# Patient Record
Sex: Female | Born: 1990 | Race: White | Hispanic: No | Marital: Single | Smoking: Never smoker
Health system: Southern US, Community
[De-identification: ages and names within clinical notes are randomized; demographics above are authoritative.]

## PROBLEM LIST (undated history)

## (undated) DIAGNOSIS — F32A Depression, unspecified: Secondary | ICD-10-CM

## (undated) DIAGNOSIS — F1010 Alcohol abuse, uncomplicated: Secondary | ICD-10-CM

## (undated) DIAGNOSIS — M419 Scoliosis, unspecified: Secondary | ICD-10-CM

## (undated) DIAGNOSIS — F329 Major depressive disorder, single episode, unspecified: Secondary | ICD-10-CM

## (undated) HISTORY — PX: OTHER SURGICAL HISTORY: SHX169

## (undated) HISTORY — DX: Depression, unspecified: F32.A

## (undated) HISTORY — PX: MOLE REMOVAL: SHX2046

## (undated) HISTORY — DX: Major depressive disorder, single episode, unspecified: F32.9

## (undated) HISTORY — DX: Alcohol abuse, uncomplicated: F10.10

## (undated) HISTORY — PX: WISDOM TOOTH EXTRACTION: SHX21

## (undated) HISTORY — DX: Scoliosis, unspecified: M41.9

---

## 2013-06-14 ENCOUNTER — Emergency Department (INDEPENDENT_AMBULATORY_CARE_PROVIDER_SITE_OTHER): Payer: BC Managed Care – PPO

## 2013-06-14 ENCOUNTER — Emergency Department (HOSPITAL_COMMUNITY)
Admission: EM | Admit: 2013-06-14 | Discharge: 2013-06-14 | Disposition: A | Discharge status: Home / Self Care | Payer: BC Managed Care – PPO | Source: Home / Self Care | Attending: Family Medicine | Admitting: Family Medicine

## 2013-06-14 ENCOUNTER — Encounter (HOSPITAL_COMMUNITY): Payer: Self-pay | Admitting: Emergency Medicine

## 2013-06-14 DIAGNOSIS — S63509A Unspecified sprain of unspecified wrist, initial encounter: Secondary | ICD-10-CM

## 2013-06-14 DIAGNOSIS — S63501A Unspecified sprain of right wrist, initial encounter: Secondary | ICD-10-CM

## 2013-06-14 DIAGNOSIS — W06XXXA Fall from bed, initial encounter: Secondary | ICD-10-CM

## 2013-06-14 NOTE — ED Provider Notes (Signed)
CSN: [id number]     Arrival date & time 06/14/13  1214 History   First MD Initiated Contact with Patient 06/14/13 1254     Chief Complaint  Patient presents with  . Wrist Injury   (Consider location/radiation/quality/duration/timing/severity/associated sxs/prior Treatment) Patient is a 22 y.o. female presenting with wrist injury. The history is provided by the patient.  Wrist Injury Location:  Wrist Time since incident:  1 week Injury: yes   Mechanism of injury comment:  Getting off of bunk bed and felt pop in wrist with applying pressure. Wrist location:  R wrist Pain details:    Quality:  Sharp   Radiates to:  Does not radiate   Severity:  Mild   Onset quality:  Sudden Chronicity:  New Dislocation: no     History reviewed. No pertinent past medical history. History reviewed. No pertinent past surgical history. No family history on file. History  Substance Use Topics  . Smoking status: Never Smoker   . Smokeless tobacco: Not on file  . Alcohol Use: Yes   OB History   Grav Para Term Preterm Abortions TAB SAB Ect Mult Living                 Review of Systems  Constitutional: Negative.   Musculoskeletal: Negative for joint swelling.  Skin: Negative.     Allergies  Amoxicillin and Bee venom  Home Medications   Current Outpatient Rx  Name  Route  Sig  Dispense  Refill  . EPINEPHrine (EPIPEN IJ)   Injection   Inject as directed.          BP 122/83  Pulse 80  Temp(Src) 99.3 F (37.4 C) (Oral)  Resp 19  SpO2 100%  LMP 06/07/2013 Physical Exam  Nursing note and vitals reviewed. Constitutional: She is oriented to person, place, and time.  Musculoskeletal: She exhibits tenderness.       Right wrist: She exhibits decreased range of motion, tenderness and bony tenderness. She exhibits no swelling and no deformity.  Neurological: She is alert and oriented to person, place, and time.    ED Course  Procedures (including critical care time) Labs  Review Labs Reviewed - No data to display Imaging Review No results found.  EKG Interpretation    Date/Time:    Ventricular Rate:    PR Interval:    QRS Duration:   QT Interval:    QTC Calculation:   R Axis:     Text Interpretation:              MDM  X-rays reviewed and report per radiologist.     Linna Hoff, MD 06/29/13 [phone]

## 2013-06-14 NOTE — ED Notes (Signed)
Pt c/o right wrist inj onset 1 week Reports she was getting out of bed(loft), and she applied to much pressure on her right wrist getting down She heard and felt a snapping sound sxs include: swelling and pain that increases w/activity She is alert w/no signs of acute distress... Has a splint upon arrival.

## 2014-01-29 IMAGING — CR DG WRIST COMPLETE 3+V*R*
4 series · 4 of 4 positions shown · non-contrast
Comparison: None.

CLINICAL DATA: Wrist injury

EXAM:
RIGHT WRIST - COMPLETE 3+ VIEW

[view not recorded (1 of 4)]
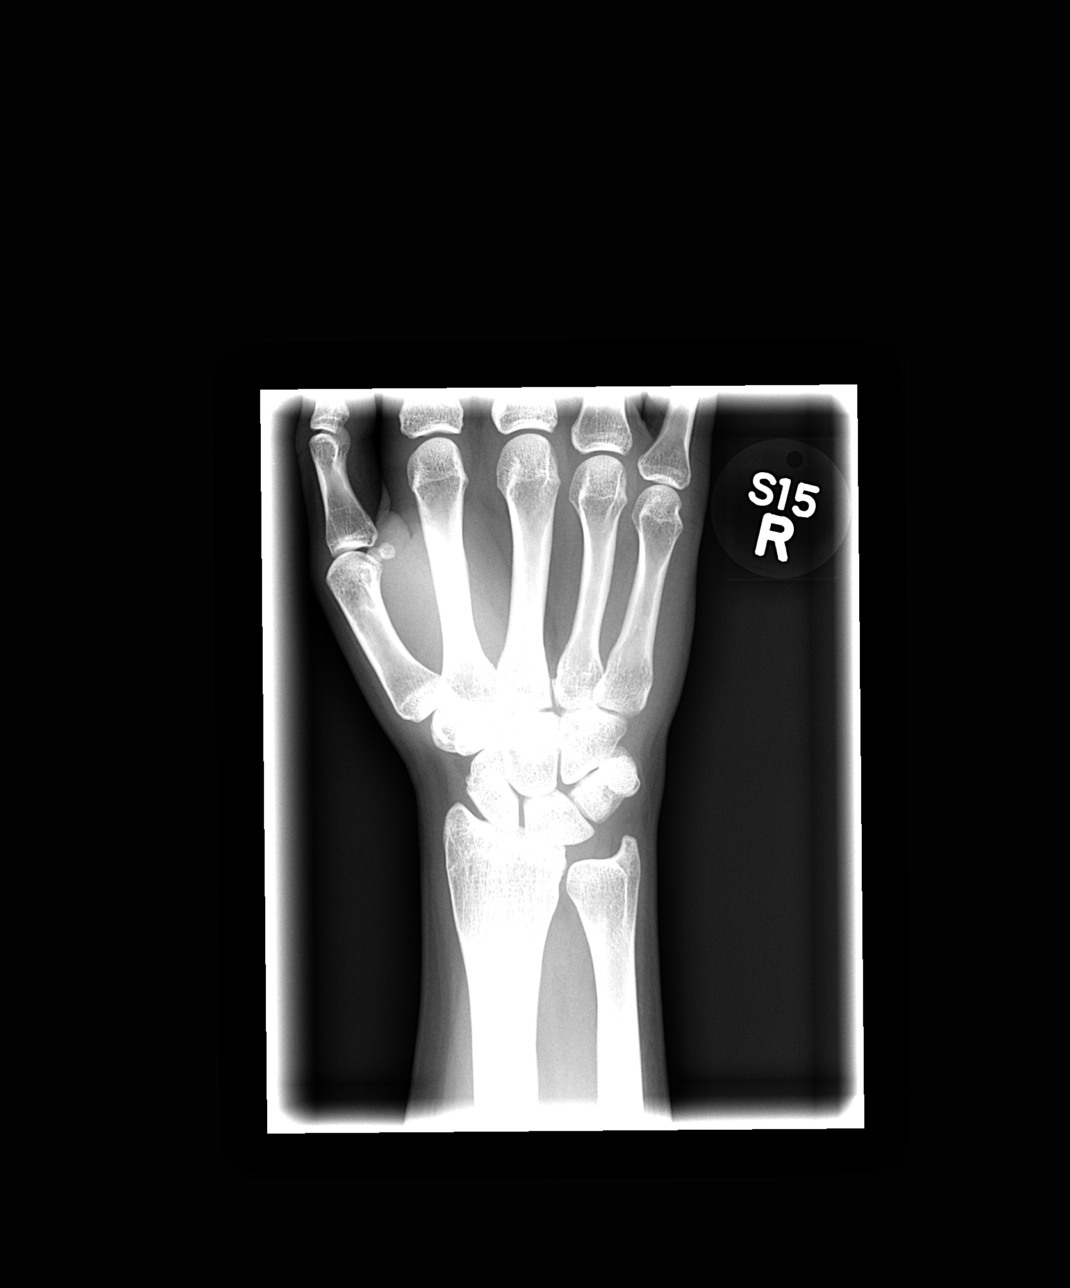

[view not recorded (2 of 4)]
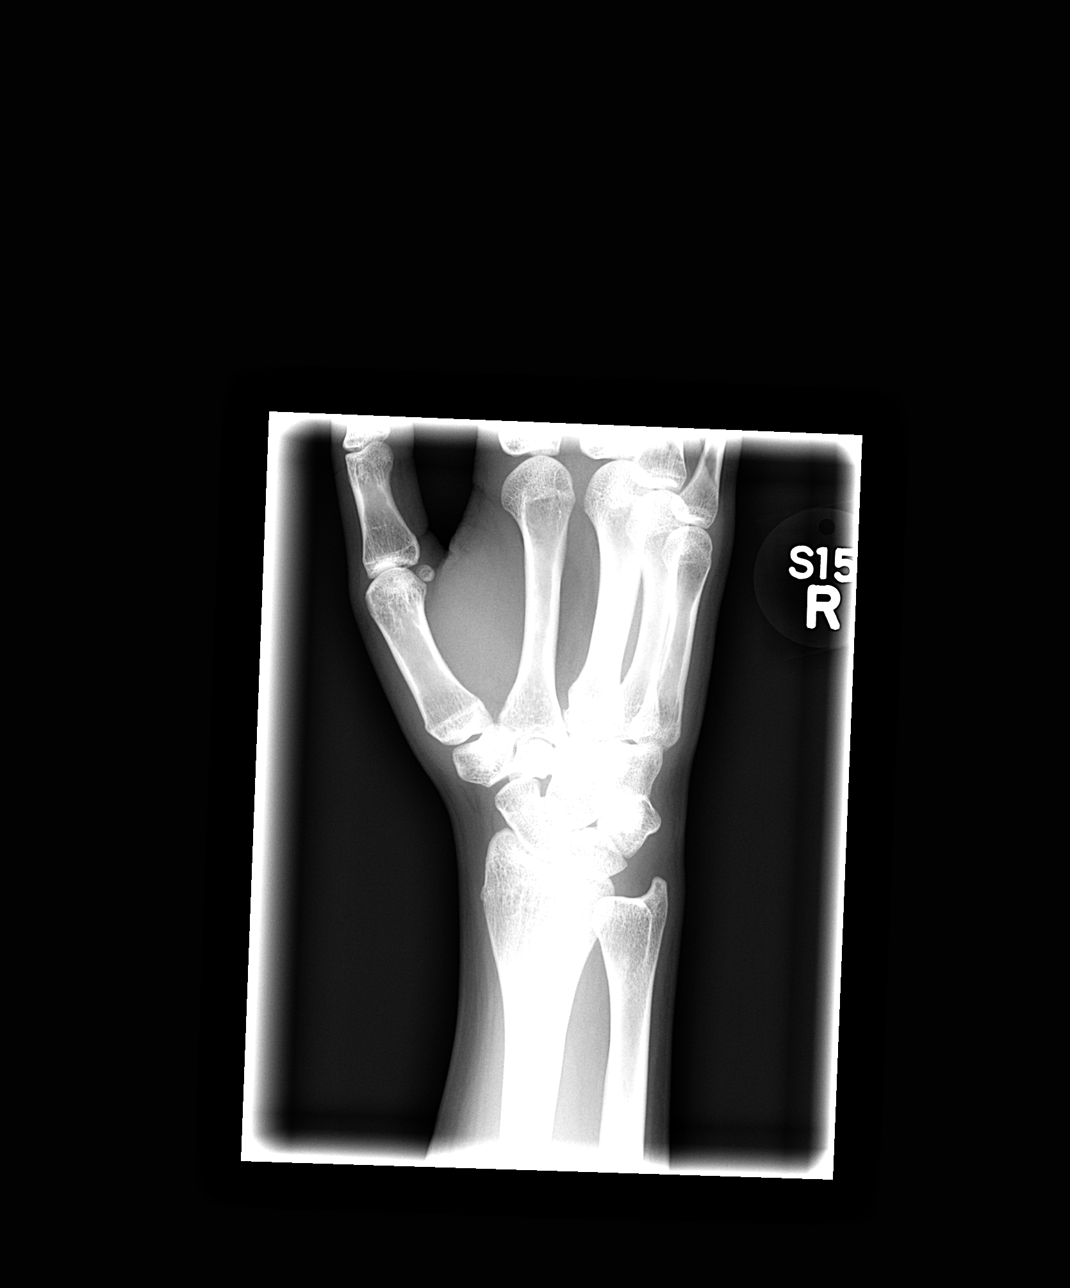

[view not recorded (3 of 4)]
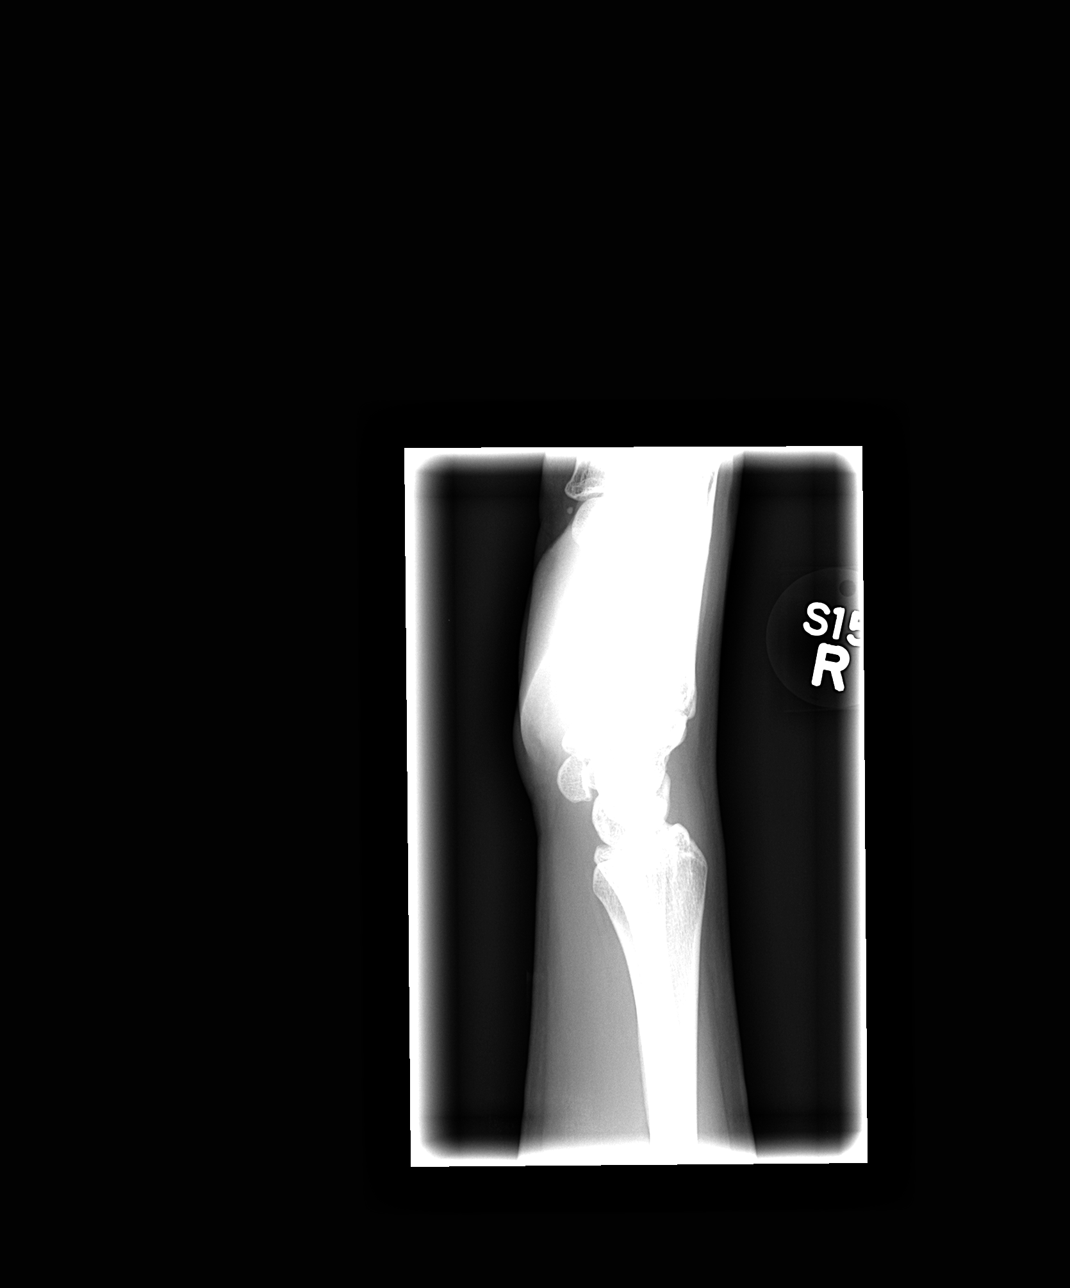

[view not recorded (4 of 4)]
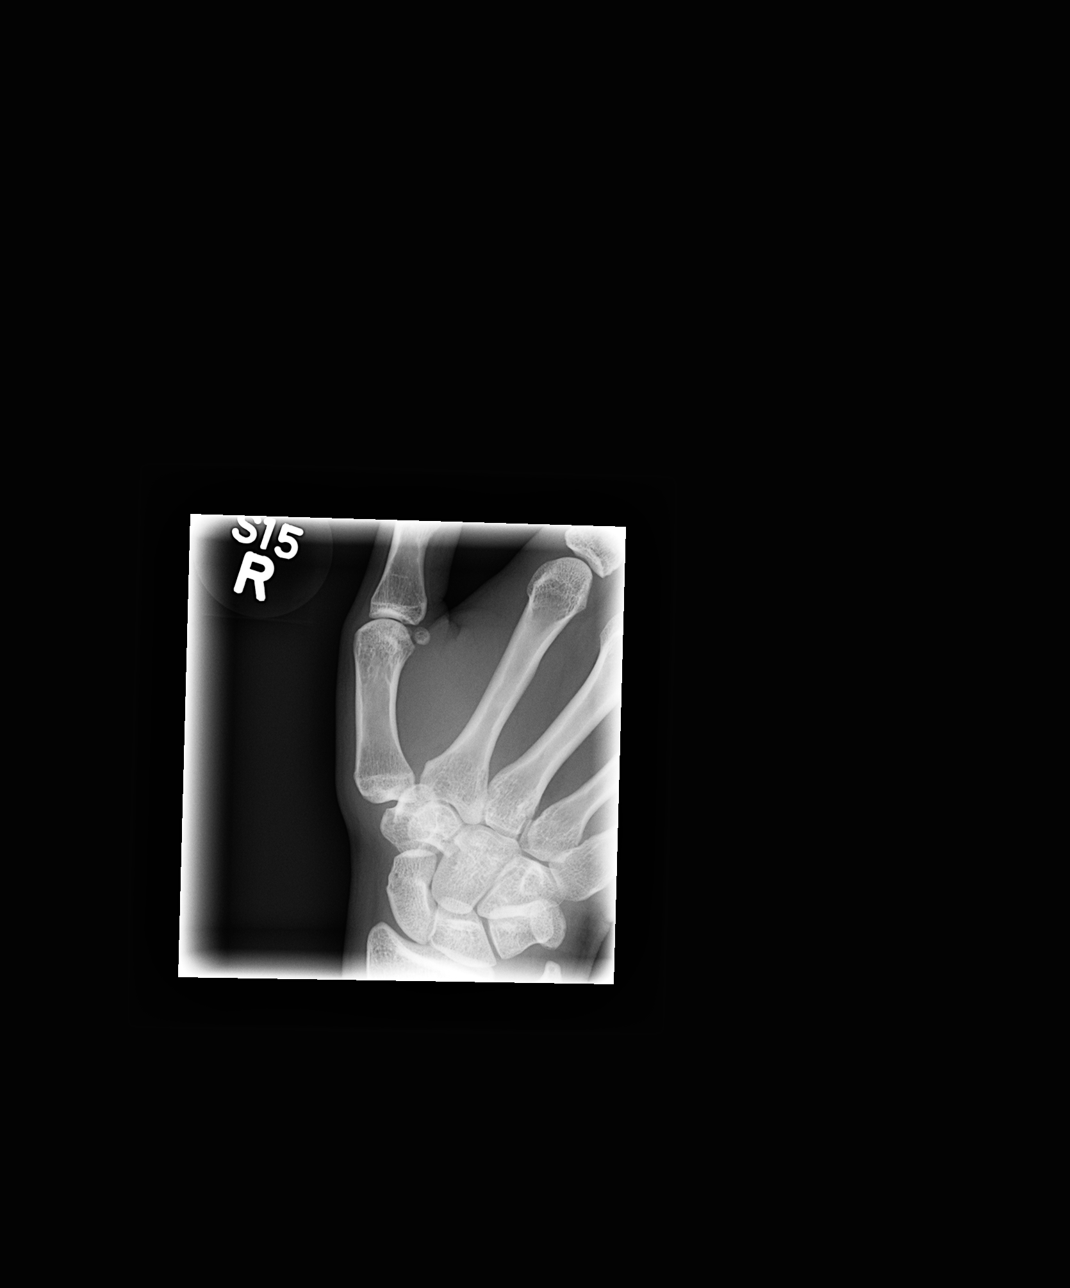

[4 of 4 positions shown; findings below may reference images not displayed]

FINDINGS: No distal radius or ulnar fracture. Radiocarpal joint is intact. No
carpal fracture. No soft tissue abnormality. :

No acute osseous abnormality.

## 2015-02-11 ENCOUNTER — Encounter (HOSPITAL_COMMUNITY): Payer: Self-pay | Admitting: Emergency Medicine

## 2015-02-11 ENCOUNTER — Encounter (HOSPITAL_COMMUNITY): Payer: Self-pay | Admitting: *Deleted

## 2015-02-11 ENCOUNTER — Emergency Department (HOSPITAL_COMMUNITY)
Admission: EM | Admit: 2015-02-11 | Discharge: 2015-02-11 | Disposition: A | Discharge status: Other Acute Inpatient Hospital | Payer: BLUE CROSS/BLUE SHIELD | Attending: Emergency Medicine | Admitting: Emergency Medicine

## 2015-02-11 ENCOUNTER — Inpatient Hospital Stay (HOSPITAL_COMMUNITY)
Admission: AD | Admit: 2015-02-11 | Discharge: 2015-02-14 | DRG: 885 | Disposition: A | Discharge status: Home / Self Care | Payer: BLUE CROSS/BLUE SHIELD | Attending: Psychiatry | Admitting: Psychiatry

## 2015-02-11 DIAGNOSIS — F1010 Alcohol abuse, uncomplicated: Secondary | ICD-10-CM | POA: Diagnosis present

## 2015-02-11 DIAGNOSIS — F322 Major depressive disorder, single episode, severe without psychotic features: Secondary | ICD-10-CM | POA: Insufficient documentation

## 2015-02-11 DIAGNOSIS — Z792 Long term (current) use of antibiotics: Secondary | ICD-10-CM | POA: Insufficient documentation

## 2015-02-11 DIAGNOSIS — F17210 Nicotine dependence, cigarettes, uncomplicated: Secondary | ICD-10-CM | POA: Diagnosis present

## 2015-02-11 DIAGNOSIS — Z880 Allergy status to penicillin: Secondary | ICD-10-CM | POA: Insufficient documentation

## 2015-02-11 DIAGNOSIS — R45851 Suicidal ideations: Secondary | ICD-10-CM

## 2015-02-11 DIAGNOSIS — F329 Major depressive disorder, single episode, unspecified: Secondary | ICD-10-CM | POA: Insufficient documentation

## 2015-02-11 LAB — TSH: TSH: 0.859 u[IU]/mL (ref 0.350–4.500)

## 2015-02-11 LAB — SALICYLATE LEVEL: Salicylate Lvl: <4 mg/dL (ref 2.8–30.0)

## 2015-02-11 LAB — RAPID URINE DRUG SCREEN, HOSP PERFORMED
Amphetamines: NOT DETECTED
Barbiturates: NOT DETECTED
Benzodiazepines: NOT DETECTED
Cocaine: NOT DETECTED
OPIATES: NOT DETECTED
Tetrahydrocannabinol: NOT DETECTED

## 2015-02-11 LAB — COMPREHENSIVE METABOLIC PANEL
ALK PHOS: 49 U/L (ref 38–126)
ALT: 13 U/L — ABNORMAL LOW (ref 14–54)
ANION GAP: 7 (ref 5–15)
AST: 19 U/L (ref 15–41)
Albumin: 4.2 g/dL (ref 3.5–5.0)
BUN: <5 mg/dL — ABNORMAL LOW (ref 6–20)
CO2: 28 mmol/L (ref 22–32)
Calcium: 8.9 mg/dL (ref 8.9–10.3)
Chloride: 104 mmol/L (ref 101–111)
Creatinine, Ser: 0.6 mg/dL (ref 0.44–1.00)
GFR calc non Af Amer: >60 mL/min (ref >60)
GLUCOSE: 107 mg/dL — AB (ref 65–99)
POTASSIUM: 3.3 mmol/L — AB (ref 3.5–5.1)
SODIUM: 139 mmol/L (ref 135–145)
Total Bilirubin: 0.4 mg/dL (ref 0.3–1.2)
Total Protein: 7.4 g/dL (ref 6.5–8.1)

## 2015-02-11 LAB — CBC
HCT: 40.1 % (ref 36.0–46.0)
Hemoglobin: 13.2 g/dL (ref 12.0–15.0)
MCH: 29.4 pg (ref 26.0–34.0)
MCHC: 32.9 g/dL (ref 30.0–36.0)
MCV: 89.3 fL (ref 78.0–100.0)
Platelets: 198 10*3/uL (ref 150–400)
RBC: 4.49 MIL/uL (ref 3.87–5.11)
RDW: 13 % (ref 11.5–15.5)
WBC: 11.1 10*3/uL — ABNORMAL HIGH (ref 4.0–10.5)

## 2015-02-11 LAB — ACETAMINOPHEN LEVEL

## 2015-02-11 LAB — ETHANOL: Alcohol, Ethyl (B): <5 mg/dL (ref <5)

## 2015-02-11 MED ORDER — HYDROXYZINE HCL 25 MG PO TABS
25.0000 mg | ORAL_TABLET | ORAL | Status: DC | PRN
  Filled 2015-02-11: qty 10

## 2015-02-11 MED ORDER — ADAPALENE 0.1 % EX CREA
1.0000 "application " | TOPICAL_CREAM | Freq: Every day | CUTANEOUS | Status: DC
  Administered 2015-02-11 – 2015-02-13 (×3): 1 via TOPICAL
  Filled 2015-02-11 (×5): qty 1

## 2015-02-11 MED ORDER — ACETAMINOPHEN 325 MG PO TABS
650.0000 mg | ORAL_TABLET | Freq: Four times a day (QID) | ORAL | Status: DC | PRN
  Administered 2015-02-14: 650 mg via ORAL
  Filled 2015-02-11: qty 2

## 2015-02-11 MED ORDER — ALUM & MAG HYDROXIDE-SIMETH 200-200-20 MG/5ML PO SUSP: 30.0000 mL | ORAL | Status: DC | PRN

## 2015-02-11 MED ORDER — CHLORDIAZEPOXIDE HCL 25 MG PO CAPS: 25.0000 mg | ORAL_CAPSULE | Freq: Four times a day (QID) | ORAL | Status: DC | PRN

## 2015-02-11 MED ORDER — TRAZODONE HCL 50 MG PO TABS
50.0000 mg | ORAL_TABLET | Freq: Every evening | ORAL | Status: DC | PRN
  Filled 2015-02-11: qty 3

## 2015-02-11 MED ORDER — CLINDAMYCIN PHOSPHATE 1 % EX GEL
1.0000 "application " | Freq: Two times a day (BID) | CUTANEOUS | Status: DC
  Administered 2015-02-11 – 2015-02-14 (×4): 1 via TOPICAL

## 2015-02-11 MED ORDER — MAGNESIUM HYDROXIDE 400 MG/5ML PO SUSP: 30.0000 mL | Freq: Every day | ORAL | Status: DC | PRN

## 2015-02-11 MED ORDER — BUPROPION HCL ER (XL) 150 MG PO TB24
150.0000 mg | ORAL_TABLET | Freq: Every day | ORAL | Status: DC
Start: 2015-02-11 — End: 2015-02-14
  Administered 2015-02-11 – 2015-02-14 (×4): 150 mg via ORAL
  Filled 2015-02-11 (×3): qty 1
  Filled 2015-02-11 (×2): qty 3
  Filled 2015-02-11 (×3): qty 1

## 2015-02-11 NOTE — [facility] Group Notes (Signed)
[facility] Group Notes:  (Nursing/MHT/Case Management/Adjunct)  Date:  02/11/2015  Time:  2:24 PM  Type of Therapy:  Life Skills  Participation Level:  Good  Participation Quality:  Good  Affect:  Flat  Cognitive:  Inttact  Insight:  Intact  Engagement in Group:    Modes of Intervention:    Summary of Progress/Problems:  Rich BraveDuke, Nishat Livingston Lynn 02/11/2015, 2:24 PM

## 2015-02-11 NOTE — [facility] Counselor (Signed)
Attempted to meet with patient to complete PSA eyt she was with PA; will make additional attempts. Nicole Bernatherine C Harrill, LCSW

## 2015-02-11 NOTE — ED Notes (Signed)
Pt arrived to the ED with a complaint of suicidal ideations.  Pt states she has no plan.  Pt came over from behavioral health and does have a bed and will go back after being medically cleared.

## 2015-02-11 NOTE — ED Notes (Addendum)
Pt with witnessed syncopal episode after lab draw. Pt became pale and diaphoretic. Pt was unconscious for approximately 20-30 seconds. Pt taken from triage room to acute room. V/S and EKG completed. Triage RN Raford PitcherFrancis Dudley made aware. [facility]

## 2015-02-11 NOTE — Progress Notes (Signed)
D: Patient states today went well. She had some of her college friends come visit and this made her happy. Her goal today was to just orient herself to the unit and she states she has met for goal for today. Her goal for tomorrow is to "explore why I'm feeling the way I do." She attended and participated in evening group. She denies SI/HI. Contracts for safety. A: Encouraged patient to continue attending groups, focus on getting better and identifying triggers that promote negative behaviors. R: Continue to monitor for patient safety.

## 2015-02-11 NOTE — H&P (Signed)
Psychiatric Admission Assessment Adult  Patient Identification: Nicole Castaneda  MRN:  [id number]  Date of Evaluation:  02/11/2015  Chief Complaint:  MDD ETOH USE DISORDER  Principal Diagnosis: <principal problem not specified>  Diagnosis:   Patient Active Problem List   Diagnosis Date Noted  . MDD (major depressive disorder) [F32.2] 02/11/2015   History of Present Illness: Nicole Castaneda is a 24 year old Caucasian female. Admitted to [facility] as a walk-in admit, medically cleared at the [facility] ED with complaints of worsening symptoms of depression, alcohol abuse & suicidal ideations. She reports, "I was feeling like, suicidal. It started a week ago. I'm not sure why. I was in [place] in [place] for a study broad program. I was [place] to the states from [place] last night because of my behavior. I was drinking a lot over there, beer, tequila, wine & other stuff that I don't know. I have been drinking heavily for several years. I guess, I'm an alcoholic by definition. I drink because it makes me feel happy & forget bad stuff, then when I drink too much at other times, I get very depressed. I don't have good relationship with my parents. I believe that they do care for me, but I feel they are using me to look good about themselves.They do support me financially, but never emotional support. I did not attempt suicide this time, but had about three years by cutting my wrists. Now, I need to be on depression medicine for this bad depression. I would like to go to a substance abuse treatment program after I discharge from here. Then, I will find a way to piece my life together. I drink socially & crave/drink alcohol when alone as well. I don't have serious medical conditions other than acne. I'm under the care of Dr. Derrek Monaco".  Elements:  Location:  Alcohol use disorder, Major depressive disorder, recurrent episodes.. Quality:  Increased alochol consumption, blurry vision,  mild tremors. Severity:  Severe. Timing:  Current. Duration:  Chronic, continuous. Context:  "Was out of the country in [place], [place] for study [employer], started drinking a lot, behaving badly, was sent back to the states". .  Associated Signs/Symptoms:  Depression Symptoms:  depressed mood, feelings of worthlessness/guilt, hopelessness, suicidal thoughts without plan, anxiety,  (Hypo) Manic Symptoms:  Irritable Mood, mood swings usually induced by alcohol  Anxiety Symptoms:  Excessive Worry,  Psychotic Symptoms:  Denies any psychotic symptoms  PTSD Symptoms: NA  Total Time spent with patient: 1 hour  Past Medical History: History reviewed. No pertinent past medical history. History reviewed. No pertinent past surgical history.  Family History: History reviewed. No pertinent family history.  Social History:  History  Alcohol Use  . Yes    Comment: "2 glasses of wine, sometimes binge drinking"     History  Drug Use No    History   Social History  . Marital Status: Single    Spouse Name: N/A  . Number of Children: N/A  . Years of Education: N/A   Social History Main Topics  . Smoking status: Light Tobacco Smoker  . Smokeless tobacco: Not on file  . Alcohol Use: Yes     Comment: "2 glasses of wine, sometimes binge drinking"  . Drug Use: No  . Sexual Activity: Yes   Other Topics Concern  . None   Social History Narrative   Additional Social History:    Pain Medications: Denies abuse Prescriptions: Denies abuse Over the Counter:  Denies abuse History of alcohol / drug use?: Yes Longest period of sobriety (when/how long): a few days Negative Consequences of Use: Personal relationships, Work / School Withdrawal Symptoms: Tremors Name of Substance 1: Alcohol 1 - Age of First Use: 17 1 - Amount (size/oz): Two glasses of wine up to seven beer plus four shots of liquor 1 - Frequency: Daily 1 - Duration: two years 1 - Last Use / Amount:  02/10/15  Musculoskeletal: Strength & Muscle Tone: within normal limits Gait & Station: normal Patient leans: N/A  Psychiatric Specialty Exam: Physical Exam  Constitutional: She is oriented to person, place, and time. She appears well-developed and well-nourished.  HENT:  Head: Normocephalic.  Eyes: Pupils are equal, round, and reactive to light.  Neck: Normal range of motion.  Cardiovascular: Normal rate and regular rhythm.   Respiratory: Effort normal.  GI: Soft.  Genitourinary:  Denies any issues  Musculoskeletal: Normal range of motion.  Neurological: She is alert and oriented to person, place, and time.  Skin: Skin is warm and dry.  Psychiatric: Her speech is normal and behavior is normal. Judgment and thought content normal. Her mood appears anxious. Her affect is not angry, not blunt, not labile and not inappropriate. Cognition and memory are normal. She exhibits a depressed mood.    Review of Systems  Constitutional: Negative.   HENT: Negative.   Eyes: Negative.   Respiratory: Negative.   Cardiovascular: Negative.   Gastrointestinal: Negative.   Genitourinary: Negative.   Musculoskeletal: Negative.   Skin: Negative.   Neurological: Negative.   Endo/Heme/Allergies: Negative.   Psychiatric/Behavioral: Positive for depression and substance abuse (Alcohol abuse). Negative for hallucinations and memory loss. The patient is nervous/anxious and has insomnia.     Blood pressure 115/76, pulse 98, temperature 98.7 F (37.1 C), temperature source Oral, resp. rate 18, height _0  (1.676 m), weight 58.968 kg (130 lb), last menstrual period 02/05/2015.Body mass index is 20.99 kg/(m^2).  General Appearance: Casual  Eye Contact::  Good  Speech:  Clear and Coherent  Volume:  Normal  Mood:  Depressed and Hopeless  Affect:  Congruent and Flat  Thought Process:  Coherent, Goal Directed and Logical  Orientation:  Full (Time, Place, and Person)  Thought Content:  Denies any  hallucinations, delusions or paranoia  Suicidal Thoughts:  Yes, but denies any intent or plan to hurt self  Homicidal Thoughts:  No  Memory:  Immediate;   Good Recent;   Good Remote;   Good  Judgement:  Fair  Insight:  Present  Psychomotor Activity:  Mild tremors  Concentration:  Fair  Recall:  Good  Fund of Knowledge:Fair  Language: Good  Akathisia:  No  Handed:  Right  AIMS (if indicated):     Assets:  Communication Skills Desire for Improvement Physical Health  ADL's:  Intact  Cognition: WNL  Sleep:      Risk to Self: Suicidal Ideation: Yes-Currently Present Suicidal Intent: No Is patient at risk for suicide?: Yes Suicidal Plan?: Yes-Currently Present Specify Current Suicidal Plan: Thought about jumping from an escalator today Access to Means: Yes Specify Access to Suicidal Means: Access to escalator What has been your use of drugs/alcohol within the last 12 months?: Pt drinking alcohol daily How many times?: 0 Other Self Harm Risks: None Triggers for Past Attempts: None known Intentional Self Injurious Behavior: Cutting Comment - Self Injurious Behavior: Pt reports she has a history of cutting and stopped 3 years ago Risk to Others: Homicidal Ideation: No Thoughts of  Harm to Others: No Current Homicidal Intent: No Current Homicidal Plan: No Access to Homicidal Means: No Identified Victim: None History of harm to others?: No Assessment of Violence: None Noted Violent Behavior Description: Pt denies history of violence Does patient have access to weapons?: No Criminal Charges Pending?: No Does patient have a court date: No Prior Inpatient Therapy: Prior Inpatient Therapy: No Prior Therapy Dates: NA Prior Therapy Facilty/Provider(s): NA Reason for Treatment: NA Prior Outpatient Therapy: Prior Outpatient Therapy: Yes Prior Therapy Dates: 2014 Prior Therapy Facilty/Provider(s): Unknown Reason for Treatment: Depression Does patient have an ACCT team?: No Does  patient have Intensive In-House Services?  : No Does patient have [facility] services? : No Does patient have P4CC services?: No  Alcohol Screening: 1. How often do you have a drink containing alcohol?: 4 or more times a week 2. How many drinks containing alcohol do you have on a typical day when you are drinking?: 5 or 6 3. How often do you have six or more drinks on one occasion?: Weekly Preliminary Score: 5 4. How often during the last year have you found that you were not able to stop drinking once you had started?: Daily or almost daily 5. How often during the last year have you failed to do what was normally expected from you becasue of drinking?: Monthly 6. How often during the last year have you needed a first drink in the morning to get yourself going after a heavy drinking session?: Less than monthly 7. How often during the last year have you had a feeling of guilt of remorse after drinking?: Weekly 8. How often during the last year have you been unable to remember what happened the night before because you had been drinking?: Daily or almost daily 9. Have you or someone else been injured as a result of your drinking?: Yes, during the last year 10. Has a relative or friend or a doctor or another health worker been concerned about your drinking or suggested you cut down?: Yes, during the last year Alcohol Use Disorder Identification Test Final Score (AUDIT): 31 Brief Intervention: Yes  Allergies:   Allergies  Allergen Reactions  . Bee Venom Anaphylaxis  . Amoxicillin Rash    Childhood allergy   Lab Results:  Results for orders placed or performed during the hospital encounter of 02/11/15 (from the past 48 hour(s))  Comprehensive metabolic panel     Status: Abnormal   Collection Time: 02/11/15  2:47 AM  Result Value Ref Range   Sodium 139 135 - 145 mmol/L   Potassium 3.3 (L) 3.5 - 5.1 mmol/L   Chloride 104 101 - 111 mmol/L   CO2 28 22 - 32 mmol/L   Glucose, Bld 107 (H) 65 - 99  mg/dL   BUN <5 (L) 6 - 20 mg/dL   Creatinine, Ser 0.60 0.44 - 1.00 mg/dL   Calcium 8.9 8.9 - 10.3 mg/dL   Total Protein 7.4 6.5 - 8.1 g/dL   Albumin 4.2 3.5 - 5.0 g/dL   AST 19 15 - 41 U/L   ALT 13 (L) 14 - 54 U/L   Alkaline Phosphatase 49 38 - 126 U/L   Total Bilirubin 0.4 0.3 - 1.2 mg/dL   GFR calc non Af Amer >60 >60 mL/min   GFR calc Af Amer >60 >60 mL/min    Comment: (NOTE) The eGFR has been calculated using the CKD EPI equation. This calculation has not been validated in all clinical situations. eGFR's persistently <60 mL/min signify  possible Chronic Kidney Disease.    Anion gap 7 5 - 15  Ethanol (ETOH)     Status: None   Collection Time: 02/11/15  2:47 AM  Result Value Ref Range   Alcohol, Ethyl (B) <5 <5 mg/dL    Comment:        LOWEST DETECTABLE LIMIT FOR SERUM ALCOHOL IS 5 mg/dL FOR MEDICAL PURPOSES ONLY   Salicylate level     Status: None   Collection Time: 02/11/15  2:47 AM  Result Value Ref Range   Salicylate Lvl <6.4 2.8 - 30.0 mg/dL  Acetaminophen level     Status: Abnormal   Collection Time: 02/11/15  2:47 AM  Result Value Ref Range   Acetaminophen (Tylenol), Serum <10 (L) 10 - 30 ug/mL    Comment:        THERAPEUTIC CONCENTRATIONS VARY SIGNIFICANTLY. A RANGE OF 10-30 ug/mL MAY BE AN EFFECTIVE CONCENTRATION FOR MANY PATIENTS. HOWEVER, SOME ARE BEST TREATED AT CONCENTRATIONS OUTSIDE THIS RANGE. ACETAMINOPHEN CONCENTRATIONS >150 ug/mL AT 4 HOURS AFTER INGESTION AND >50 ug/mL AT 12 HOURS AFTER INGESTION ARE OFTEN ASSOCIATED WITH TOXIC REACTIONS.   CBC     Status: Abnormal   Collection Time: 02/11/15  2:47 AM  Result Value Ref Range   WBC 11.1 (H) 4.0 - 10.5 K/uL   RBC 4.49 3.87 - 5.11 MIL/uL   Hemoglobin 13.2 12.0 - 15.0 g/dL   HCT 40.1 36.0 - 46.0 %   MCV 89.3 78.0 - 100.0 fL   MCH 29.4 26.0 - 34.0 pg   MCHC 32.9 30.0 - 36.0 g/dL   RDW 13.0 11.5 - 15.5 %   Platelets 198 150 - 400 K/uL  Urine rapid drug screen (hosp performed) (Not at [facility])      Status: None   Collection Time: 02/11/15  5:39 AM  Result Value Ref Range   Opiates NONE DETECTED NONE DETECTED   Cocaine NONE DETECTED NONE DETECTED   Benzodiazepines NONE DETECTED NONE DETECTED   Amphetamines NONE DETECTED NONE DETECTED   Tetrahydrocannabinol NONE DETECTED NONE DETECTED   Barbiturates NONE DETECTED NONE DETECTED    Comment:        DRUG SCREEN FOR MEDICAL PURPOSES ONLY.  IF CONFIRMATION IS NEEDED FOR ANY PURPOSE, NOTIFY LAB WITHIN 5 DAYS.        LOWEST DETECTABLE LIMITS FOR URINE DRUG SCREEN Drug Class       Cutoff (ng/mL) Amphetamine      1000 Barbiturate      200 Benzodiazepine   403 Tricyclics       474 Opiates          300 Cocaine          300 THC              50    Current Medications: Current Facility-Administered Medications  Medication Dose Route Frequency Provider Last Rate Last Dose  . acetaminophen (TYLENOL) tablet 650 mg  650 mg Oral Q6H PRN Encarnacion Slates, NP      . alum & mag hydroxide-simeth (MAALOX/MYLANTA) 200-200-20 MG/5ML suspension 30 mL  30 mL Oral Q4H PRN Encarnacion Slates, NP      . buPROPion (WELLBUTRIN XL) 24 hr tablet 150 mg  150 mg Oral Daily Encarnacion Slates, NP      . chlordiazePOXIDE (LIBRIUM) capsule 25 mg  25 mg Oral QID PRN Encarnacion Slates, NP      . hydrOXYzine (ATARAX/VISTARIL) tablet 25 mg  25 mg Oral Q4H PRN Herbert Pun  I Nwoko, NP      . magnesium hydroxide (MILK OF MAGNESIA) suspension 30 mL  30 mL Oral Daily PRN Encarnacion Slates, NP      . traZODone (DESYREL) tablet 50 mg  50 mg Oral QHS PRN Encarnacion Slates, NP       PTA Medications: Prescriptions prior to admission  Medication Sig Dispense Refill Last Dose  . adapalene (DIFFERIN) 0.1 % cream Apply 1 application topically at bedtime.   Past Week at Unknown time  . clindamycin (CLINDAGEL) 1 % gel Apply 1 application topically 2 (two) times daily.   Past Week at Unknown time  . EPINEPHrine (EPIPEN IJ) Inject 1 pen as directed as needed (for severe allergic reaction).    Not Taking at  Unknown time   Previous Psychotropic Medications: Yes   Substance Abuse History in the last 12 months:  Yes.    Consequences of Substance Abuse: Medical Consequences:  Liver damage, Possible death by overdose Legal Consequences:  Arrests, jail time, Loss of driving privilege. Family Consequences:  Family discord, divorce and or separation.  Results for orders placed or performed during the hospital encounter of 02/11/15 (from the past 72 hour(s))  Comprehensive metabolic panel     Status: Abnormal   Collection Time: 02/11/15  2:47 AM  Result Value Ref Range   Sodium 139 135 - 145 mmol/L   Potassium 3.3 (L) 3.5 - 5.1 mmol/L   Chloride 104 101 - 111 mmol/L   CO2 28 22 - 32 mmol/L   Glucose, Bld 107 (H) 65 - 99 mg/dL   BUN <5 (L) 6 - 20 mg/dL   Creatinine, Ser 0.60 0.44 - 1.00 mg/dL   Calcium 8.9 8.9 - 10.3 mg/dL   Total Protein 7.4 6.5 - 8.1 g/dL   Albumin 4.2 3.5 - 5.0 g/dL   AST 19 15 - 41 U/L   ALT 13 (L) 14 - 54 U/L   Alkaline Phosphatase 49 38 - 126 U/L   Total Bilirubin 0.4 0.3 - 1.2 mg/dL   GFR calc non Af Amer >60 >60 mL/min   GFR calc Af Amer >60 >60 mL/min    Comment: (NOTE) The eGFR has been calculated using the CKD EPI equation. This calculation has not been validated in all clinical situations. eGFR's persistently <60 mL/min signify possible Chronic Kidney Disease.    Anion gap 7 5 - 15  Ethanol (ETOH)     Status: None   Collection Time: 02/11/15  2:47 AM  Result Value Ref Range   Alcohol, Ethyl (B) <5 <5 mg/dL    Comment:        LOWEST DETECTABLE LIMIT FOR SERUM ALCOHOL IS 5 mg/dL FOR MEDICAL PURPOSES ONLY   Salicylate level     Status: None   Collection Time: 02/11/15  2:47 AM  Result Value Ref Range   Salicylate Lvl <9.8 2.8 - 30.0 mg/dL  Acetaminophen level     Status: Abnormal   Collection Time: 02/11/15  2:47 AM  Result Value Ref Range   Acetaminophen (Tylenol), Serum <10 (L) 10 - 30 ug/mL    Comment:        THERAPEUTIC CONCENTRATIONS  VARY SIGNIFICANTLY. A RANGE OF 10-30 ug/mL MAY BE AN EFFECTIVE CONCENTRATION FOR MANY PATIENTS. HOWEVER, SOME ARE BEST TREATED AT CONCENTRATIONS OUTSIDE THIS RANGE. ACETAMINOPHEN CONCENTRATIONS >150 ug/mL AT 4 HOURS AFTER INGESTION AND >50 ug/mL AT 12 HOURS AFTER INGESTION ARE OFTEN ASSOCIATED WITH TOXIC REACTIONS.   CBC     Status:  Abnormal   Collection Time: 02/11/15  2:47 AM  Result Value Ref Range   WBC 11.1 (H) 4.0 - 10.5 K/uL   RBC 4.49 3.87 - 5.11 MIL/uL   Hemoglobin 13.2 12.0 - 15.0 g/dL   HCT 40.1 36.0 - 46.0 %   MCV 89.3 78.0 - 100.0 fL   MCH 29.4 26.0 - 34.0 pg   MCHC 32.9 30.0 - 36.0 g/dL   RDW 13.0 11.5 - 15.5 %   Platelets 198 150 - 400 K/uL  Urine rapid drug screen (hosp performed) (Not at [facility])     Status: None   Collection Time: 02/11/15  5:39 AM  Result Value Ref Range   Opiates NONE DETECTED NONE DETECTED   Cocaine NONE DETECTED NONE DETECTED   Benzodiazepines NONE DETECTED NONE DETECTED   Amphetamines NONE DETECTED NONE DETECTED   Tetrahydrocannabinol NONE DETECTED NONE DETECTED   Barbiturates NONE DETECTED NONE DETECTED    Comment:        DRUG SCREEN FOR MEDICAL PURPOSES ONLY.  IF CONFIRMATION IS NEEDED FOR ANY PURPOSE, NOTIFY LAB WITHIN 5 DAYS.        LOWEST DETECTABLE LIMITS FOR URINE DRUG SCREEN Drug Class       Cutoff (ng/mL) Amphetamine      1000 Barbiturate      200 Benzodiazepine   845 Tricyclics       364 Opiates          300 Cocaine          300 THC              50     Observation Level/Precautions:  15 minute checks  Laboratory:  Per ED: Potassium 3.2  Psychotherapy: Group sessions  Medications: Librium 25 mg prn for ETOH withdrawal, Hydroxyzine 25 mg prn, Trazodone 50 mg for insomnia    Consultations: As needed   Discharge Concerns: Safety, mood stabilization  Estimated LOS: 3-5 days  Other:     Psychological Evaluations: Yes   Treatment Plan Summary: Daily contact with patient to assess and evaluate symptoms and  progress in treatment and Medication management: 1. Admit for crisis management and stabilization, estimated length of stay 3-5 days.  2. Medication management to reduce current symptoms to base line and improve the patient's overall level of functioning; Initiate Wellbutrin XL 150 mg daily for depression, Librium 25 mg qid prn for alcohol withdrawal symptoms, Hydroxyzine 25 mg prn for anxiety  3. Treat health problems as indicated.  4. Develop treatment plan to decrease risk of relapse upon discharge and the need for readmission.  5. Psycho-social education regarding relapse prevention and self care.  6. Health care follow up as needed for medical problems.  7. Review, reconcile, and reinstate any pertinent home medications for other health issues where appropriate. 8. Call for consults with hospitalist for any additional specialty patient care services as needed.  Medical Decision Making:  New problem, with additional work up planned, Review of Psycho-Social Stressors (1), Review or order clinical lab tests (1), Review and summation of old records (2), Review of Medication Regimen & Side Effects (2) and Review of New Medication or Change in Dosage (2)  I certify that inpatient services furnished can reasonably be expected to improve the patient's condition.   Encarnacion Slates, PMHNP, FNP-BC 7/17/20161:05 PM I personally assessed the patient, reviewed the physical exam and labs and formulated the treatment plan Geralyn Flash A. Sabra Heck, M.D.

## 2015-02-11 NOTE — [facility] Group Notes (Addendum)
[facility] Group Notes:  (Nursing/MHT/Case Management/Adjunct)  Date:  02/11/2015  Time:  0930  Type of Therapy:  Relaxation Group: The focus of the group is centered on helping patients relax and deescalate to the point where they can experience how beneficial this coping skill can be.  Participation Level: good    Participation Quality:  good  Affect:  flat  Cognitive:  intact  Insight:  intact  Engagement in Group:  engaged  Modes of Intervention:    Summary of Progress/Problems:  Nicole Castaneda, Nicole Castaneda Lynn 02/11/2015, 1:39 PM

## 2015-02-11 NOTE — Tx Team (Signed)
Initial Interdisciplinary Treatment Plan   PATIENT STRESSORS: Educational concerns Marital or family conflict Substance abuse Traumatic event   PATIENT STRENGTHS: Ability for insight Average or above average intelligence Communication skills   PROBLEM LIST: Problem List/Patient Goals Date to be addressed Date deferred Reason deferred Estimated date of resolution  Safety/Suicide Risk 02/11/2015     Depression 02/11/2015     Substance abuse 02/11/2015     "To feel like I have a bit of a purpose." 02/11/2015     "No to worry so much about what other people think." 02/11/2015                              DISCHARGE CRITERIA:  Improved stabilization in mood, thinking, and/or behavior Verbal commitment to aftercare and medication compliance Withdrawal symptoms are absent or subacute and managed without 24-hour nursing intervention  PRELIMINARY DISCHARGE PLAN: Attend 12-step recovery group Outpatient therapy Return to previous work or school arrangements  PATIENT/FAMIILY INVOLVEMENT: This treatment plan has been presented to and reviewed with the patient, Nicole Castaneda.  The patient and family have been given the opportunity to ask questions and make suggestions.  Lendell CapriceGuthrie, Kairah Leoni A 02/11/2015, 4:09 PM

## 2015-02-11 NOTE — Progress Notes (Signed)
Adult Psychoeducational Group Note  Date:  02/11/2015 Time:  9:08 PM  Group Topic/Focus:  Wrap-Up Group:   The focus of this group is to help patients review their daily goal of treatment and discuss progress on daily workbooks.  Participation Level:  Active  Participation Quality:  Appropriate  Affect:  Appropriate  Cognitive:  Appropriate  Insight: Appropriate  Engagement in Group:  Engaged  Modes of Intervention:  Discussion  Additional Comments:  Pt stated her goal for today was to get orientated to the unit, which she did. Pt stated her goal for tomorrow is to explore why she has been feeling the way she has been feeling.  Caswell CorwinOwen, Eara Burruel C 02/11/2015, 9:08 PM

## 2015-02-11 NOTE — Progress Notes (Signed)
Patient admitted voluntarily for SI after drinking for several days [place] She reports that she was to be there for 1 year studying abroad, she had been drinking and  made suicidal statements reporting that she wanted to die. She reports that she drinks daily and various amounts of alcohol. She has no medical hx. She has 2 epi-pens and acne medication in the medication room. Skin assessment - pt. has a old surgery scar to her left wrist and left calf. Patient was oriented to unit and went to cafeteria for breakfast. Admission paperwork was signed and admission will need to be completed by oncoming RN. She currently denies si/hi/a/v hallucinations.

## 2015-02-11 NOTE — Progress Notes (Signed)
D: Patient is alert and oriented. Pt's mood and affect is sad, depressed, and sullen. Pt denies HI and AVH. Pt reports passive suicidal thoughts today. Pt rates depression 9/10, hopelessness 8/10, and anxiety 3/10. Pt reports her goal for the day is "getting into the process." Pt's alcohol use disorder identification test score is 31. Pt is attending unit groups today. A: Active listening by RN. Encouragement/Support provided to pt. Alcohol use education reviewed with pt and hand out given to pt, Provider made aware of score. Medication education reviewed with pt. Scheduled medications administered per providers orders (See MAR). 15 minute checks continued per protocol for patient safety.  R: Pt verbally contracts for safety, agrees not to harm self and agrees to come to staff with increased intensity of suicidal thoughts. Patient cooperative and receptive to nursing interventions. Pt remains safe.

## 2015-02-11 NOTE — ED Provider Notes (Signed)
CSN: [id number]     Arrival date & time 02/11/15  0147 History   First MD Initiated Contact with Patient 02/11/15 0230     Chief Complaint  Patient presents with  . Suicidal     (Consider location/radiation/quality/duration/timing/severity/associated sxs/prior Treatment) HPI Comments: Patient is a 24 year old female with no past medical history who presents with suicidal ideations for the past few days. Patient reports feeling depressed lately without known trigger. She denies any specific plan. She denies HI. Patient reports excessive alcohol consumption also. She drinks wine, liquor, or beer daily. She has anywhere from 2 drinks to drinking until she passes out. No drug use.    History reviewed. No pertinent past medical history. History reviewed. No pertinent past surgical history. History reviewed. No pertinent family history. History  Substance Use Topics  . Smoking status: Never Smoker   . Smokeless tobacco: Not on file  . Alcohol Use: Yes   OB History    No data available     Review of Systems  Psychiatric/Behavioral: Positive for suicidal ideas and dysphoric mood.  All other systems reviewed and are negative.     Allergies  Bee venom and Amoxicillin  Home Medications   Prior to Admission medications   Medication Sig Start Date End Date Taking? Authorizing Provider  adapalene (DIFFERIN) 0.1 % cream Apply 1 application topically at bedtime.   Yes Historical Provider, MD  clindamycin (CLINDAGEL) 1 % gel Apply 1 application topically 2 (two) times daily.   Yes Historical Provider, MD  EPINEPHrine (EPIPEN IJ) Inject 1 pen as directed as needed (for severe allergic reaction).     Historical Provider, MD   BP 107/76 mmHg  Pulse 87  Temp(Src) 98.5 F (36.9 C)  Resp 17  Ht  (1.676 m)  Wt 135 lb (61.236 kg)  BMI 21.80 kg/m2  SpO2 98%  LMP 02/05/2015 (Approximate) Physical Exam  Constitutional: She is oriented to person, place, and time. She appears  well-developed and well-nourished. No distress.  HENT:  Head: Normocephalic and atraumatic.  Eyes: Conjunctivae and EOM are normal.  Neck: Normal range of motion.  Cardiovascular: Normal rate and regular rhythm.  Exam reveals no gallop and no friction rub.   No murmur heard. Pulmonary/Chest: Effort normal and breath sounds normal. She has no wheezes. She has no rales. She exhibits no tenderness.  Abdominal: Soft. She exhibits no distension. There is no tenderness. There is no rebound.  Musculoskeletal: Normal range of motion.  Neurological: She is alert and oriented to person, place, and time. Coordination normal.  Speech is goal-oriented. Moves limbs without ataxia.   Skin: Skin is warm and dry.  Psychiatric:  Flat affect. Dysphoric mood.   Nursing note and vitals reviewed.   ED Course  Procedures (including critical care time) Labs Review Labs Reviewed  COMPREHENSIVE METABOLIC PANEL - Abnormal; Notable for the following:    Potassium 3.3 (*)    Glucose, Bld 107 (*)    BUN <5 (*)    ALT 13 (*)    All other components within normal limits  ACETAMINOPHEN LEVEL - Abnormal; Notable for the following:    Acetaminophen (Tylenol), Serum <10 (*)    All other components within normal limits  CBC - Abnormal; Notable for the following:    WBC 11.1 (*)    All other components within normal limits  ETHANOL  SALICYLATE LEVEL  URINE RAPID DRUG SCREEN, HOSP PERFORMED  CBG MONITORING, ED    Imaging Review No results found.  EKG Interpretation None      MDM   Final diagnoses:  Suicidal ideation   4:31 AM Patient medically cleared and will be sent to Behavioral health.    Emilia BeckKaitlyn Rutilio Yellowhair, PA-C 02/11/15 [id number]  Tomasita CrumbleAdeleke Oni, MD 02/11/15 1353

## 2015-02-11 NOTE — ED Notes (Signed)
Per Ala DachFord at [facility], [facility].

## 2015-02-11 NOTE — [facility] Group Notes (Signed)
[facility] LCSW Group Therapy  02/11/2015   1:15 PM  Type of Therapy:  Group Therapy  Participation Level:  Active  Participation Quality:  Attentive and Sharing  Affect:  Appropriate  Cognitive:  Alert and Oriented  Insight:  Developing/Improving  Engagement in Therapy:  Developing/Improving  Modes of Intervention:  Discussion, Exploration, Problem-solving, Socialization and Support   Summary of Progress/Problems: The main focus of today's process group was to identify the patient's current support system and decide on other supports that can be put in place. An emphasis was placed on using counselor, doctor, therapy groups, 12-step groups, and problem-specific support groups to expand supports. There was also an extensive discussion about possible fears related to success. Patient shared that she needs some new supports in her life and expressed a willingness to work with a therapist. Patient did identify with group discussion of "expectations of failures verses success." Patient reports trying geographic cures.    Nicole Castaneda, Nicole Castaneda
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
## 2015-02-11 NOTE — ED Notes (Signed)
Pt had a syncopal episode in triage as she was having her blood drawn.  Pt regained consciousness and was taken to room 9

## 2015-02-11 NOTE — Plan of Care (Signed)
Problem: Ineffective individual coping Goal: STG: Patient will remain free from self harm Outcome: Progressing Patient remains free from self harm. 15 minute checks continued per protocol for patient safety.   Problem: Alteration in mood Goal: LTG-Patient reports reduction in suicidal thoughts (Patient reports reduction in suicidal thoughts and is able to verbalize a safety plan for whenever patient is feeling suicidal)  Outcome: Not Progressing Patient is endorsing passive suicidal thoughts today. Pt is able to verbally contract for safety with RN.  Problem: Diagnosis: Increased Risk For Suicide Attempt Goal: STG-Patient Will Attend All Groups On The Unit Outcome: Progressing Patient is attending unit groups today.  Problem: Alteration in mood & ability to function due to Goal: STG-Patient will comply with prescribed medication regimen (Patient will comply with prescribed medication regimen)  Outcome: Progressing Patient has adhered to medication regimen today with ease.
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
## 2015-02-12 DIAGNOSIS — F322 Major depressive disorder, single episode, severe without psychotic features: Principal | ICD-10-CM

## 2015-02-12 MED ORDER — GUAIFENESIN 100 MG/5ML PO SYRP
200.0000 mg | ORAL_SOLUTION | ORAL | Status: DC | PRN
  Administered 2015-02-12 – 2015-02-13 (×5): 200 mg via ORAL
  Filled 2015-02-12 (×6): qty 10

## 2015-02-12 NOTE — Progress Notes (Signed)
D:  Patient's self inventory sheet, patient sleeps good, no sleep medication given.  Fair appetite, low energy level, poor concentration.  Rated depression 7, hopeless 6, anxiety 2.  Denied withdrawals.  SI thoughts off/on, contracts for safety.  Physical problems, cough.  Denied physical pain.  Goal is to stop worrying about things she cannot change.  Plans to accept she can't control everything.  No discharge plans. A:  Medications administered per MD orders.  Emotional support and encouragement. R:  Denied SI and HI while talking to nurse this morning, contracts for safety.  Denied A/V hallucinations.  Denied pain.  Safety maintained with 15 minute checks.

## 2015-02-12 NOTE — Progress Notes (Signed)
Pt attended the evening AA speaker meeting. 

## 2015-02-12 NOTE — Progress Notes (Signed)
[facility] MD Progress Note  02/12/2015 6:18 PM Nicole Castaneda  MRN:  [id number] Subjective:  Patient states she is feeling " a little better". She is not endorsing significant alcohol WDL symptoms at this time. Denies medication side effects at present. Objective : I have discussed case with treatment team and have met with patient. She is a 24 year old college student who recently had travelled to [place] for a study abroad experience. She has a history of heavy drinking and in [place] had been drinking more heavily, up to 8 beers per day. In the context of travel, change of environment , heavy drinking, she developed depression and suicidal thoughts , which led for her to be sent back to [place]- states she arrived 4- 5 days ago, and last drank 4 days ago. She reports a chronically poor relationship with her parents as an ongoing stressor which also perpetuates her depression. At this time no tremors, no diaphoresis, no acute distress or agitation and vitals are stable . She denies current cravings to drink. She is visible in day room, and behavior is in good control. Noted to socialize with peers of around her age . No medication side effects reported at present . TSH WNL.   Principal Problem: MDD (major depressive disorder) Diagnosis:   Patient Active Problem List   Diagnosis Date Noted  . MDD (major depressive disorder) [F32.2] 02/11/2015  . Alcohol abuse [F10.10] 02/11/2015   Total Time spent with patient: 25 minutes    Past Medical History: History reviewed. No pertinent past medical history. History reviewed. No pertinent past surgical history. Family History: History reviewed. No pertinent family history. Social History:  History  Alcohol Use  . Yes    Comment: "2 glasses of wine, sometimes binge drinking"     History  Drug Use No    History   Social History  . Marital Status: Single    Spouse Name: N/A  . Number of Children: N/A  . Years of Education: N/A   Social  History Main Topics  . Smoking status: Light Tobacco Smoker  . Smokeless tobacco: Not on file  . Alcohol Use: Yes     Comment: "2 glasses of wine, sometimes binge drinking"  . Drug Use: No  . Sexual Activity: Yes   Other Topics Concern  . None   Social History Narrative   Additional History:    Sleep: improved   Appetite:  improved    Assessment:   Musculoskeletal: Strength & Muscle Tone: within normal limits Gait & Station: normal Patient leans: N/A   Psychiatric Specialty Exam: Physical Exam  ROS- no nausea, no vomiting, no seizures .   Blood pressure 108/84, pulse 97, temperature 97.5 F (36.4 C), temperature source Oral, resp. rate 16, height 5' 6"  (1.676 m), weight 130 lb (58.968 kg), last menstrual period 02/05/2015.Body mass index is 20.99 kg/(m^2).  General Appearance: Fairly Groomed  Engineer, water::  Good  Speech:  Normal Rate  Volume:  Normal  Mood:  Anxious and Depressed- but states feeling better compared to admission  Affect:  Constricted and but reactive, smiles at times appropriately  Thought Process:  Goal Directed and Linear  Orientation:  Full (Time, Place, and Person)  Thought Content:  denies hallucinations, no delusions, ruminative about poor relationship with parents   Suicidal Thoughts:  No- at this time denies any thoughts of hurting self or of SI  Homicidal Thoughts:  No  Memory:  recent and remote grossly intact   Judgement:  Fair  Insight:  Present  Psychomotor Activity:  Normal- no restlessness , no agitation at this time  Concentration:  Good  Recall:  Good  Fund of Knowledge:Good  Language: Good  Akathisia:  Negative  Handed:  Right  AIMS (if indicated):     Assets:  Communication Skills Desire for Improvement Physical Health Resilience  ADL's:  Improving   Cognition: WNL  Sleep:  Number of Hours: 6.75     Current Medications: Current Facility-Administered Medications  Medication Dose Route Frequency Provider Last Rate  Last Dose  . acetaminophen (TYLENOL) tablet 650 mg  650 mg Oral Q6H PRN Encarnacion Slates, NP      . adapalene (DIFFERIN) 0.1 % cream 1 application  1 application Topical QHS Encarnacion Slates, NP   1 application at 91/69/45 2221  . alum & mag hydroxide-simeth (MAALOX/MYLANTA) 200-200-20 MG/5ML suspension 30 mL  30 mL Oral Q4H PRN Encarnacion Slates, NP      . buPROPion (WELLBUTRIN XL) 24 hr tablet 150 mg  150 mg Oral Daily Encarnacion Slates, NP   150 mg at 02/12/15 0804  . chlordiazePOXIDE (LIBRIUM) capsule 25 mg  25 mg Oral QID PRN Encarnacion Slates, NP      . clindamycin (CLINDAGEL) 1 % gel 1 application  1 application Topical BID Encarnacion Slates, NP   1 application at 03/88/82 0809  . guaifenesin (ROBITUSSIN) 100 MG/5ML syrup 200 mg  200 mg Oral Q4H PRN Encarnacion Slates, NP   200 mg at 02/12/15 1400  . hydrOXYzine (ATARAX/VISTARIL) tablet 25 mg  25 mg Oral Q4H PRN Encarnacion Slates, NP      . magnesium hydroxide (MILK OF MAGNESIA) suspension 30 mL  30 mL Oral Daily PRN Encarnacion Slates, NP      . traZODone (DESYREL) tablet 50 mg  50 mg Oral QHS PRN Encarnacion Slates, NP        Lab Results:  Results for orders placed or performed during the hospital encounter of 02/11/15 (from the past 48 hour(s))  TSH     Status: None   Collection Time: 02/11/15  7:38 PM  Result Value Ref Range   TSH 0.859 0.350 - 4.500 uIU/mL    Comment: Performed at [facility]    Physical Findings: AIMS: Facial and Oral Movements Muscles of Facial Expression: None, normal Lips and Perioral Area: None, normal Jaw: None, normal Tongue: None, normal,Extremity Movements Upper (arms, wrists, hands, fingers): None, normal Lower (legs, knees, ankles, toes): None, normal, Trunk Movements Neck, shoulders, hips: None, normal, Overall Severity Severity of abnormal movements (highest score from questions above): None, normal Incapacitation due to abnormal movements: None, normal Patient's awareness of abnormal movements (rate only  patient's report): No Awareness, Dental Status Current problems with teeth and/or dentures?: No Does patient usually wear dentures?: No  CIWA:  CIWA-Ar Total: 1 COWS:  COWS Total Score: 2   Assessment- patient remains depressed, anxious, but states she is feeling better and currently denies any SI. She states depression related to alcohol but also to poor relationship with parents. At this time not presenting with any acute distress or active alcohol WDL symptoms. She is tolerating medications well.  Treatment Plan Summary: Daily contact with patient to assess and evaluate symptoms and progress in treatment, Medication management, Plan ongoing inpatient treatment and medication management as below Librium PRNs for potential alcohol WDL symptoms Wellbutrin XL 150 mgrs QDAY for depression- we have reviewed side effects, patient aware  of potential for insomnia, anorexia, seizures ( no history of seizures or eating disorder endorsed )  Vistaril PRNs for anxiety as needed  Trazodone 50 mgrs  QHS PRN for insomnia as needed  I offered patient to arrange for family meeting with parents , but she states she is not interested in this .   Medical Decision Making:  Established Problem, Stable/Improving (1), Review of Psycho-Social Stressors (1), Review or order clinical lab tests (1) and Review of Medication Regimen & Side Effects (2)     COBOS, FERNANDO 02/12/2015, 6:18 PM

## 2015-02-12 NOTE — [facility] Group Notes (Signed)
[facility]  02/12/2015 8:45 AM  Participation Quality: Alert, Appropriate and Oriented  Mood/Affect: Flat and Depressed  Depression Rating: 7  Anxiety Rating: 1  Thoughts of Suicide: Pt denies SI/HI  Will you contract for safety? Yes  Current AVH: Pt denies  Plan for Discharge/Comments: Pt attended discharge planning group and actively participated in group. CSW discussed suicide prevention education with the group and encouraged them to discuss discharge planning and any relevant barriers. Pt describes feeling "okay" today but still experiencing depression. Pt reports that she will stay with her friends for a week after discharge while she looks for an apartment.  Transportation Means: Pt reports access to transportation  Supports: Friends  Chad CordialLauren Carter, Theresia MajorsLCSWA 02/12/2015 9:26 AM

## 2015-02-12 NOTE — [facility] Suicide Risk Assessment (Signed)
[facility] INPATIENT:  Family/Significant Other Suicide Prevention Education  Suicide Prevention Education:  Education Completed; Nicole DareMeagan Castaneda, Pt's roommate, [phone]) has been identified by the patient as the family member/significant other with whom the patient will be residing, and identified as the person(s) who will aid the patient in the event of a mental health crisis (suicidal ideations/suicide attempt).  With written consent from the patient, the family member/significant other has been provided the following suicide prevention education, prior to the and/or following the discharge of the patient.  The suicide prevention education provided includes the following:  Suicide risk factors  Suicide prevention and interventions  National Suicide Hotline telephone number  [facility] assessment telephone number  [facility] Emergency Assistance 911  [facility] and/or Residential Mobile Crisis Unit telephone number  Request made of family/significant other to:  Remove weapons (e.g., guns, rifles, knives), all items previously/currently identified as safety concern.    Remove drugs/medications (over-the-counter, prescriptions, illicit drugs), all items previously/currently identified as a safety concern.  The family member/significant other verbalizes understanding of the suicide prevention education information provided.  The family member/significant other agrees to remove the items of safety concern listed above.  Nicole Castaneda, Nicole Castaneda 02/12/2015, 5:15 PM2

## 2015-02-12 NOTE — Progress Notes (Signed)
D: Nicole Castaneda had some of her college friends come visit her today. They have been visiting her regularly. She was observed to be smiling and laughing during their conversation in the milieu. She states today was "fine". She took the initiative this evening to attend AA group. She says it was helpful. Rates Depression 4/Anxiety 0-1 this evening. She continues to be pleasant but quiet and reserved. She denies SI/HI. Contracts for safety.  A: Praised Nicole Castaneda for taking the initiative to attend AA group tonight. Encouraged her to continue to focus on her goals and completion of them in order to help prepare her for discharge. One of her goals was to attend AA group tonight and she was able to complete her goal. R: Continue to monitor for her safety.

## 2015-02-12 NOTE — [facility] Group Notes (Signed)
[facility] LCSW Group Therapy  02/12/2015 1:15pm  Type of Therapy:  Group Therapy vercoming Obstacles  Participation Level:  Minimal  Participation Quality:  Reserved  Affect:  Appropriate  Cognitive:  Appropriate and Oriented  Insight:  Developing/Improving and Improving  Engagement in Therapy:  Limited  Modes of Intervention:  Discussion, Exploration, Problem-solving and Support  Description of Group:   In this group patients will be encouraged to explore what they see as obstacles to their own wellness and recovery. They will be guided to discuss their thoughts, feelings, and behaviors related to these obstacles. The group will process together ways to cope with barriers, with attention given to specific choices patients can make. Each patient will be challenged to identify changes they are motivated to make in order to overcome their obstacles. This group will be process-oriented, with patients participating in exploration of their own experiences as well as giving and receiving support and challenge from other group members.  Summary of Patient Progress: Pt participated minimally in group discussion, but was observed to be attentive to others. When prompted Pt identified making "tough decisions" related to choosing which friends were healthy in here life was a necessary step to overcome her obstacle of depression.   Therapeutic Modalities:   Cognitive Behavioral Therapy Solution Focused Therapy Motivational Interviewing Relapse Prevention Therapy   Chad CordialLauren Carter, LCSWA 02/12/2015 5:27 PM

## 2015-02-12 NOTE — Progress Notes (Signed)
Recreation Therapy Notes  Date: 07.18.16 Time: 9:30 am Location: [address] Group Room  Group Topic: Stress Management  Goal Area(s) Addresses:  Patient will verbalize importance of using healthy stress management.  Patient will identify positive emotions associated with healthy stress management.   Behavioral Response:  Engaged  Intervention: Stress Management  Activity :  Guided Training and development officermagery Script.  LRT introduced and educated patients on the stress management technique of guided imagery.  A script was used to deliver the technique to patients.  Patients were asked to follow the script read a loud by LRT to engage in practicing the stress management technique.  Education:  Stress Management, Discharge Planning.   Education Outcome: Acknowledges edcuation/In group clarification offered/Needs additional education  Clinical Observations/Feedback: Patient attended group.   Nicole RancherMarjette Anjulie Castaneda, LRT/CTRS  Nicole AbedLindsay, Nicole Castaneda A 02/12/2015 2:12 PM

## 2015-02-13 MED ORDER — BUSPIRONE HCL 5 MG PO TABS
5.0000 mg | ORAL_TABLET | Freq: Three times a day (TID) | ORAL | Status: DC
  Administered 2015-02-14 (×2): 5 mg via ORAL
  Filled 2015-02-13: qty 9
  Filled 2015-02-13 (×2): qty 1
  Filled 2015-02-13: qty 9
  Filled 2015-02-13: qty 1
  Filled 2015-02-13 (×2): qty 9
  Filled 2015-02-13: qty 1
  Filled 2015-02-13: qty 9

## 2015-02-13 NOTE — Progress Notes (Signed)
Recreation Therapy Notes  Animal-Assisted Activity (AAA) Program Checklist/Progress Notes Patient Eligibility Criteria Checklist & Daily Group note for Rec Tx Intervention  Date: 07.19.16 Time: 2:45 pm [address]: [address]   AAA/T Program Assumption of Risk Form signed by Patient/ or Parent Legal Guardian yes  Patient is free of allergies or sever asthma yes  Patient reports no fear of animals yes  Patient reports no history of cruelty to animalsyes  Patient understands his/her participation is voluntary yes  Patient washes hands before animal contact yes  Patient washes hands after animal contact yes  Behavioral Response: Engaged  Education: Charity fundraiserHand Washing, Appropriate Animal Interaction   Education Outcome: Acknowledges understanding/In group clarification offered  Clinical Observations/Feedback: Patient attended group.   Caroll RancherMarjette Erikson Danzy, LRT/CTRS         Caroll RancherLindsay, Dior Dominik A 02/13/2015 3:55 PM

## 2015-02-13 NOTE — Progress Notes (Signed)
Patient ID: Nicole Castaneda, female   DOB: [birth year], 24 y.o.   MRN: [id number] [facility] MD Progress Note  02/13/2015 7:07 PM Adylynn Hertenstein  MRN:  [id number] Subjective:  Patient reports improved mood. Remains somewhat anxious. Denies medication side effects. Objective : I have discussed case with treatment team and have met with patient. Going to groups , and has gone to an [place] meeting which she found " interesting". She has been socializing with select peers. Behavior on unit is in good control. At this time not presenting with any acute WDL. States she has not had cravings to drink. We reviewed alcohol abuse/dependence  ( definition of illness, treatment goals, importance of avoiding not just alcohol but also other addictive substances  ) .This is patient's first episode of treatment and is not familiar with concepts of abuse, dependence, tolerance, and  We also reviewed risks associated with chronic heavy drinking, to include medical illnesses and increased risk of accidents. Patient motivated in sobriety. Patient continues to ruminate about poor relationship with her parents, and wonders if the emotional pain she experiences from their poor relationship has contributed to her drinking. Depressive symptoms are abating. No medication side effects .  Principal Problem: MDD (major depressive disorder) Diagnosis:   Patient Active Problem List   Diagnosis Date Noted  . MDD (major depressive disorder) [F32.2] 02/11/2015  . Alcohol abuse [F10.10] 02/11/2015   Total Time spent with patient: 25 minutes    Past Medical History: History reviewed. No pertinent past medical history. History reviewed. No pertinent past surgical history. Family History: History reviewed. No pertinent family history. Social History:  History  Alcohol Use  . Yes    Comment: "2 glasses of wine, sometimes binge drinking"     History  Drug Use No    History   Social History  . Marital Status: Single    Spouse  Name: N/A  . Number of Children: N/A  . Years of Education: N/A   Social History Main Topics  . Smoking status: Light Tobacco Smoker  . Smokeless tobacco: Not on file  . Alcohol Use: Yes     Comment: "2 glasses of wine, sometimes binge drinking"  . Drug Use: No  . Sexual Activity: Yes   Other Topics Concern  . None   Social History Narrative   Additional History:    Sleep: improved   Appetite:  improved    Assessment:   Musculoskeletal: Strength & Muscle Tone: within normal limits- no tremors, no diaphoresis, no acute distress or restlessness  Gait & Station: normal Patient leans: N/A   Psychiatric Specialty Exam: Physical Exam  ROS- no nausea, no vomiting, no seizures .   Blood pressure 113/84, pulse 89, temperature 97.4 F (36.3 C), temperature source Oral, resp. rate 16, height 5' 6"  (1.676 m), weight 130 lb (58.968 kg), last menstrual period 02/05/2015.Body mass index is 20.99 kg/(m^2).  General Appearance: Well Groomed  Engineer, water::  Good  Speech:  Normal Rate  Volume:  Normal  Mood:  Anxious, less depressed , better compared to admission  Affect:  less constricted, still anxious  Thought Process:  Goal Directed and Linear  Orientation:  Full (Time, Place, and Person)  Thought Content:  denies hallucinations, no delusions, ruminative about poor relationship with parents   Suicidal Thoughts:  No- at this time denies any thoughts of hurting self or of SI  Homicidal Thoughts:  No  Memory:  recent and remote grossly intact   Judgement:  Other:  improving  Insight:  Present  Psychomotor Activity:  Normal- no restlessness , no agitation at this time  Concentration:  Good  Recall:  Good  Fund of Knowledge:Good  Language: Good  Akathisia:  Negative  Handed:  Right  AIMS (if indicated):     Assets:  Communication Skills Desire for Improvement Physical Health Resilience  ADL's:  Improving   Cognition: WNL  Sleep:  Number of Hours: 6     Current  Medications: Current Facility-Administered Medications  Medication Dose Route Frequency Provider Last Rate Last Dose  . acetaminophen (TYLENOL) tablet 650 mg  650 mg Oral Q6H PRN Encarnacion Slates, NP      . adapalene (DIFFERIN) 0.1 % cream 1 application  1 application Topical QHS Encarnacion Slates, NP   1 application at 99/37/16 2151  . alum & mag hydroxide-simeth (MAALOX/MYLANTA) 200-200-20 MG/5ML suspension 30 mL  30 mL Oral Q4H PRN Encarnacion Slates, NP      . buPROPion (WELLBUTRIN XL) 24 hr tablet 150 mg  150 mg Oral Daily Encarnacion Slates, NP   150 mg at 02/13/15 0818  . chlordiazePOXIDE (LIBRIUM) capsule 25 mg  25 mg Oral QID PRN Encarnacion Slates, NP      . clindamycin (CLINDAGEL) 1 % gel 1 application  1 application Topical BID Encarnacion Slates, NP   1 application at 96/78/93 [phone]  . guaifenesin (ROBITUSSIN) 100 MG/5ML syrup 200 mg  200 mg Oral Q4H PRN Encarnacion Slates, NP   200 mg at 02/13/15 1157  . hydrOXYzine (ATARAX/VISTARIL) tablet 25 mg  25 mg Oral Q4H PRN Encarnacion Slates, NP      . magnesium hydroxide (MILK OF MAGNESIA) suspension 30 mL  30 mL Oral Daily PRN Encarnacion Slates, NP      . traZODone (DESYREL) tablet 50 mg  50 mg Oral QHS PRN Encarnacion Slates, NP        Lab Results:  Results for orders placed or performed during the hospital encounter of 02/11/15 (from the past 48 hour(s))  TSH     Status: None   Collection Time: 02/11/15  7:38 PM  Result Value Ref Range   TSH 0.859 0.350 - 4.500 uIU/mL    Comment: Performed at [facility]    Physical Findings: AIMS: Facial and Oral Movements Muscles of Facial Expression: None, normal Lips and Perioral Area: None, normal Jaw: None, normal Tongue: None, normal,Extremity Movements Upper (arms, wrists, hands, fingers): None, normal Lower (legs, knees, ankles, toes): None, normal, Trunk Movements Neck, shoulders, hips: None, normal, Overall Severity Severity of abnormal movements (highest score from questions above): None,  normal Incapacitation due to abnormal movements: None, normal Patient's awareness of abnormal movements (rate only patient's report): No Awareness, Dental Status Current problems with teeth and/or dentures?: No Does patient usually wear dentures?: No  CIWA:  CIWA-Ar Total: 1 COWS:  COWS Total Score: 2   Assessment-Depression significantly improved and presenting with more reactive affect. No SI. Anxiety persists, and she feels she has chronic difficulties with her parents that contribute to anxiety and probably have contributed to drinking as well. No alcohol WDL symptoms. She is tolerating medications well. We discussed potential for Wellbutrin to increase anxiety, be anxiogenic, but patient states this medication seems to be working well for her at this time and wants to continue.   Treatment Plan Summary: Daily contact with patient to assess and evaluate symptoms and progress in treatment, Medication management, Plan ongoing inpatient treatment and medication  management as below Librium PRNs for potential alcohol WDL symptoms Wellbutrin XL 150 mgrs QDAY for depression Start Buspar 5 mgrs TID for anxiety  Vistaril PRNs for anxiety as needed  Trazodone 50 mgrs  QHS PRN for insomnia as needed  Patient interested in PHP as disposition plan    Medical Decision Making:  Established Problem, Stable/Improving (1), Review of Psycho-Social Stressors (1), Review or order clinical lab tests (1) and Review of Medication Regimen & Side Effects (2)     COBOS, FERNANDO 02/13/2015, 7:07 PM

## 2015-02-13 NOTE — Tx Team (Signed)
Interdisciplinary Treatment Plan Update (Adult) Date: 02/13/2015   Date: 02/13/2015 8:30 AM  Progress in Treatment:  Attending groups: Yes  Participating in groups: Yes, minimally  Taking medication as prescribed: Yes  Tolerating medication: Yes  Family/Significant othe contact made: Yes, with roommate Patient understands diagnosis: Yes Discussing patient identified problems/goals with staff: Yes  Medical problems stabilized or resolved: Yes  Denies suicidal/homicidal ideation: Yes Patient has not harmed self or Others: Yes   New problem(s) identified: None identified at this time.   Discharge Plan or Barriers: Pt will return to her apartment with her roommate and follow[facility]up with [facility]  Additional comments:  Pt is 23 YO single rising college junior admitted with diagnosis of Major Depressive Disorder, Recurrent Severe without Psychotic features and Alcohol Use Disorder, Severe and report of SI. Patient was brought to ED by friends [place] [facility] [place] due to concern for pt's mental health and substance abuse issues.   Reason for Continuation of Hospitalization:  Depression Medication stabilization Suicidal ideation Withdrawal symptoms  Estimated length of stay: 2-3 days  For review of initial/current patient goals, please see plan of care.    Attendees:  Patient:    Family:    Physician: Dr. Jama Flavorsobos MD  02/13/2015 8:26 AM  Nursing:   02/13/2015 8:26 AM  Clinical Social Worker Leotis ShamesLauren Davina Pokearter, LCSWA, MSW 02/13/2015 8:26 AM  Other: Leisa LenzValerie Enoch, Vesta MixerMonarch Liasion 02/13/2015 8:26 AM  Clinical:  Quintella ReichertBeverly Knight, RN; Earl ManySara Twyman, RN; Jan, RN 02/13/2015 8:26 AM  Other: , RN Charge Nurse 02/13/2015 8:26 AM  Other:      Chad CordialLauren Carter, Theresia MajorsLCSWA MSW

## 2015-02-13 NOTE — Progress Notes (Signed)
D:  Patient's self inventory sheet, patient had fair sleep last night, no sleep medication given.  Fair appetite, low energy level, between good/poor for patient's concentration.  Denied withdrawals.  Denied SI.  Denied SI.  Denied physical problems.  Has experienced cough, headaches in past 24 hours.  Denied pain.  Goal is to genuinely laugh.  Plans to hang out with friends.  Does have discharge plans.   A:  Medications administered per MD orders.  Emotional support and encouragement given patient. R:  Denied SI and HI, contracts for safety.  Denied A/V hallucinations.  Denied  Pain.  Safety maintained with 15 minute checks. Patient has issues with her parents.

## 2015-02-13 NOTE — Progress Notes (Signed)
Adult Psychoeducational Group Note  Date:  02/13/2015 Time:  9:01 PM  Group Topic/Focus:  Wrap-Up Group:   The focus of this group is to help patients review their daily goal of treatment and discuss progress on daily workbooks.  Participation Level:  Active  Participation Quality:  Appropriate and Attentive  Affect:  Appropriate  Cognitive:  Appropriate  Insight: Appropriate  Engagement in Group:  Engaged  Modes of Intervention:  Activity  Additional Comments:  Pts played a therapeutic activity of Mental Health Jeopardy. Pt was engaged and actively participated.  Moe Graca C 02/13/2015, 9:01 PM 

## 2015-02-13 NOTE — Progress Notes (Signed)
Adult Psychoeducational Group Note  Date:  02/13/2015 Time:  0900  Group Topic/Focus:  Diagnosis Education:   The focus of this group is to discuss the major disorders that patients maybe diagnosed with.  Group discusses the importance of knowing what one's diagnosis is so that one can understand treatment and better advocate for oneself.  Participation Level:  Active  Participation Quality:  Appropriate, Attentive and Supportive  Affect:  Appropriate  Cognitive:  Alert  Insight: Appropriate and Good  Engagement in Group:  Engaged  Modes of Intervention:  Clarification, Education, Orientation and Support  Additional Comments:  Able to identify one daily goal to accomplish today.  Earline MayotteKnight, Deerica Waszak Shephard 02/13/2015, 9:57 AM

## 2015-02-13 NOTE — [facility] Group Notes (Signed)
[facility] LCSW Group Therapy 02/13/2015 1:15 PM  Type of Therapy: Group Therapy- Feelings about Diagnosis  Participation Level: Active   Participation Quality:  Appropriate  Affect:  Appropriate  Cognitive: Alert and Oriented   Insight:  Developing   Engagement in Therapy: Developing/Improving and Engaged   Modes of Intervention: Clarification, Confrontation, Discussion, Education, Exploration, Limit-setting, Orientation, Problem-solving, Rapport Building, Dance movement psychotherapisteality Testing, Socialization and Support  Description of Group:   This group will allow patients to explore their thoughts and feelings about diagnoses they have received. Patients will be guided to explore their level of understanding and acceptance of these diagnoses. Facilitator will encourage patients to process their thoughts and feelings about the reactions of others to their diagnosis, and will guide patients in identifying ways to discuss their diagnosis with significant others in their lives. This group will be process-oriented, with patients participating in exploration of their own experiences as well as giving and receiving support and challenge from other group members.  Summary of Progress/Problems:  Pt presented with brighter affect and was more active in group discussion. Pt discussed how finding out her diagnosis provided her some relief because she was able to understand it and treat it. Pt also agreed with other members regarding the difficulties of dealing with family members who do not understand what mental illness is or how to help in an effective way.   Therapeutic Modalities:   Cognitive Behavioral Therapy Solution Focused Therapy Motivational Interviewing Relapse Prevention Therapy  Chad CordialLauren Carter, LCSWA 02/13/2015 5:02 PM

## 2015-02-14 DIAGNOSIS — F322 Major depressive disorder, single episode, severe without psychotic features: Secondary | ICD-10-CM | POA: Insufficient documentation

## 2015-02-14 LAB — PREGNANCY, URINE: PREG TEST UR: NEGATIVE

## 2015-02-14 MED ORDER — BUPROPION HCL ER (XL) 150 MG PO TB24: 150.0000 mg | ORAL_TABLET | Freq: Every day | ORAL | Status: DC

## 2015-02-14 MED ORDER — TRAZODONE HCL 50 MG PO TABS: 50.0000 mg | ORAL_TABLET | Freq: Every evening | ORAL | Status: DC | PRN

## 2015-02-14 MED ORDER — HYDROXYZINE HCL 25 MG PO TABS: 25.0000 mg | ORAL_TABLET | ORAL | Status: DC | PRN

## 2015-02-14 MED ORDER — BUSPIRONE HCL 5 MG PO TABS: 5.0000 mg | ORAL_TABLET | Freq: Three times a day (TID) | ORAL | Status: DC

## 2015-02-14 NOTE — Progress Notes (Signed)
D: Per patient self inventory form patient reports she slept fair last night. She reports a fair appetite, normal energy level, good concentration. She rates depression 2/10, hopelessness 0/10, anxiety 0/10- all on 0-10 scale, 10 being the worse. She denies SI/HI. Denies AVH. She c/o head ache. Patient reports she is eager to be discharged. Observed in the day room socializing with peers.  A: Special checks q 15 mins in place for safety. Medication administered per MD order (see eMAR). PRN medication given for c/o HA. Encouragement and support provided. D/C planning in place.  R:Safety maintained. Compliant with medication regimen. Reports relief from prn medication. Will continue to  Monitor.

## 2015-02-14 NOTE — Progress Notes (Signed)
Pt called [facility] post discharge to ask about her follow-up appointment. Writer reviewed AVS to gather related information. Pt was informed accordingly.

## 2015-02-14 NOTE — Progress Notes (Signed)
Recreation Therapy Notes  Date: 07.20.16 Time: 9:30 am Location: [address] Hall Group Room  Group Topic: Stress Management  Goal Area(s) Addresses:  Patient will verbalize importance of using healthy stress management.  Patient will identify positive emotions associated with healthy stress management.   Intervention: Stress Management  Activity :  Progressive Muscle Relaxation.  LRT will introduce and educate patients on the stress management technique of progressive muscle relaxation.  A script was used to present  the technique to the patients.  Patients were asked to follow a long with the script read a loud by the LRT.  Education:  Stress Management, Discharge Planning.   Education Outcome: Acknowledges edcuation/In group clarification offered/Needs additional education  Clinical Observations/Feedback: Patient did not attend group.   Caroll RancherMarjette Haedyn Ancrum, LRT/CTRS         Lillia AbedLindsay, Britton Bera A 02/14/2015 10:08 AM

## 2015-02-14 NOTE — Progress Notes (Signed)
Discharge note: Patient discharged per MD order. Discharge summary reviewed with pt. Pt verbalizes understanding of discharge summary. Medication regimen reviewed with patient, sample medication and rx given. Patient expresses understanding. Patient denies SI/HI at discharge. Denies AVH. Denies physical pain at discharge. All belongings given back to pt from locker. Patient home medication given back to patient from medication room. Patient verbalizes and signs that she received all items at discharge. Friend in lobby for discharge, ambulatory out of facility.

## 2015-02-14 NOTE — [facility] Suicide Risk Assessment (Signed)
[facility]   Demographic Factors:  24 year old single female, college student, living with roommate   Total Time spent with patient: 30 minutes  Musculoskeletal: Strength & Muscle Tone: within normal limits Gait & Station: normal Patient leans: N/A  Psychiatric Specialty Exam: Physical Exam  ROS  Blood pressure 116/84, pulse 81, temperature 98.1 F (36.7 C), temperature source Oral, resp. rate 16, height 5\' 6"  (1.676 m), weight 130 lb (58.968 kg), last menstrual period 02/05/2015.Body mass index is 20.99 kg/(m^2).  General Appearance: Well Groomed  Patent attorneyye Contact::  Good  Speech:  Normal Rate409  Volume:  Normal  Mood:  euthymic, denies depression  Affect:  Appropriate  Thought Process:  Goal Directed and Linear  Orientation:  Full (Time, Place, and Person)  Thought Content:  no hallucinations, no delusions  Suicidal Thoughts:  No  Homicidal Thoughts:  No  Memory:  recent and remote grossly intact   Judgement:  Other:  improving  Insight:  improved   Psychomotor Activity:  Normal  Concentration:  Good  Recall:  Good  Fund of Knowledge:Good  Language: Good  Akathisia:  Negative  Handed:  Right  AIMS (if indicated):     Assets:  Communication Skills Desire for Improvement Resilience Social Support Vocational/Educational  Sleep:  Number of Hours: 5.5  Cognition: WNL  ADL's: improved    Have you used any form of tobacco in the last 30 days? (Cigarettes, Smokeless Tobacco, Cigars, and/or Pipes): Yes  Has this patient used any form of tobacco in the last 30 days? (Cigarettes, Smokeless Tobacco, Cigars, and/or Pipes) Yes, A prescription for an FDA-approved tobacco cessation medication was offered at discharge and the patient refused  Mental Status Per Nursing Assessment::   On Admission:     Current Mental Status by Physician: At this time she is much improved compared to her admission- her mood is now euthymic, her affect is bright, she is no  longer feeling anxious, she is not presenting with any lingering WDL symptoms, she has no thought disorder, no hallucinations, no delusions, no SI, no HI.   Loss Factors: Recent trip to [place], poor relationship with parents .   Historical Factors: This is first psychiatric admission, history of self cutting, not in several years, alcohol dependence by history  Risk Reduction Factors:   Sense of responsibility to family, Living with another person, especially a relative and Positive coping skills or problem solving skills  Continued Clinical Symptoms:  As mentioned , much improved compared to admission  Cognitive Features That Contribute To Risk:  No gross cognitive deficits noted upon discharge. Is alert , attentive, and oriented x 3    Suicide Risk:  Mild:  Suicidal ideation of limited frequency, intensity, duration, and specificity.  There are no identifiable plans, no associated intent, mild dysphoria and related symptoms, good self-control (both objective and subjective assessment), few other risk factors, and identifiable protective factors, including available and accessible social support.  Principal Problem: MDD (major depressive disorder) Discharge Diagnoses:  Patient Active Problem List   Diagnosis Date Noted  . MDD (major depressive disorder) [F32.2] 02/11/2015  . Alcohol abuse [F10.10] 02/11/2015    Follow-up Information    Follow up with [facility] On 03/02/2015.   Why:  Appointment for medications management w Crystal, NP, at this provider at 9 AM on 03/02/15.  Please call to cancel or reschedule if necessary.   Contact information:   [address],  [place], [place] [address] Phone: (  [phone] Fax:  [phone]       Follow up with [facility] On 02/20/2015.   Why:  at 2:00pm for therapy.   Contact information:   [address],  [place], [place] [address] Phone: [phone] Fax:  [phone]      Plan Of  Care/Follow-up recommendations:  Activity:  as tolerated Diet:  NA Tests:  See below  Other:  see below  Is patient on multiple antipsychotic therapies at discharge:  No   Has Patient had three or more failed trials of antipsychotic monotherapy by history:  No  Recommended Plan for Multiple Antipsychotic Therapies: NA   Patient is leaving in good spirits  Plans to return to her apartment Follow up as above - plans to start PHP on Friday.     Nicole Castaneda 02/14/2015, 11:16 AM

## 2015-02-14 NOTE — Progress Notes (Signed)
  [facility] :  Will you be returning to the same living situation after discharge:  Yes,  Pt will return to her apartment At discharge, do you have transportation home?: Yes,  friend to provide transportation Do you have the ability to pay for your medications: Yes,  Pt provided with samples and prescriptions  Release of information consent forms completed and in the chart;  Patient's signature needed at discharge.  Patient to Follow up at: Follow-up Information    [facility] On 02/16/2015.   Why:  at 8:30am for your initial assessment. Please bring your insurance card with you.   Contact information:   [address] [place] [address] [phone]      Patient denies SI/HI: Yes,  Pt denies    Safety Planning and Suicide Prevention discussed: Yes,  with friend. See SPE note for further details  Have you used any form of tobacco in the last 30 days? (Cigarettes, Smokeless Tobacco, Cigars, and/or Pipes): Yes  Has patient been referred to the Quitline?: Patient refused referral  Elaina HoopsCarter, Stephnie Parlier M 02/14/2015, 11:32 AM

## 2015-02-14 NOTE — Discharge Summary (Signed)
Physician Discharge Summary Note  Patient:  Nicole SiadVictoria Fiallos is an 24 y.o., female MRN:  [id number] DOB:  04/17/1991 Patient phone:  [phone] (home)  [facility] [address] [place] [address],  Total Time spent with patient: 45 minutes  Date of Admission:  02/11/2015 Date of Discharge: 02/14/2015  Reason for Admission:  depression  Principal Problem: MDD (major depressive disorder) Discharge Diagnoses: Patient Active Problem List   Diagnosis Date Noted  . Major depressive disorder, single episode, severe without psychotic features [F32.2]   . MDD (major depressive disorder) [F32.2] 02/11/2015  . Alcohol abuse [F10.10] 02/11/2015    Musculoskeletal: Strength & Muscle Tone: within normal limits Gait & Station: normal Patient leans: N/A  Psychiatric Specialty Exam:  SEE SRA Physical Exam  Vitals reviewed.   ROS  Blood pressure 116/84, pulse 81, temperature 98.1 F (36.7 C), temperature source Oral, resp. rate 16, height 5\' 6"  (1.676 m), weight 58.968 kg (130 lb), last menstrual period 02/05/2015.Body mass index is 20.99 kg/(m^2).  Have you used any form of tobacco in the last 30 days? (Cigarettes, Smokeless Tobacco, Cigars, and/or Pipes): Yes  Has this patient used any form of tobacco in the last 30 days? (Cigarettes, Smokeless Tobacco, Cigars, and/or Pipes) N/A  Past Medical History: History reviewed. No pertinent past medical history. History reviewed. No pertinent past surgical history. Family History: History reviewed. No pertinent family history. Social History:  History  Alcohol Use  . Yes    Comment: "2 glasses of wine, sometimes binge drinking"     History  Drug Use No    History   Social History  . Marital Status: Single    Spouse Name: N/A  . Number of Children: N/A  . Years of Education: N/A   Social History Main Topics  . Smoking status: Light Tobacco Smoker  . Smokeless tobacco: Not on file  . Alcohol Use: Yes     Comment:  "2 glasses of wine, sometimes binge drinking"  . Drug Use: No  . Sexual Activity: Yes   Other Topics Concern  . None   Social History Narrative  Risk to Self: Suicidal Ideation: Yes-Currently Present Suicidal Intent: No Is patient at risk for suicide?: Yes Suicidal Plan?: Yes-Currently Present Specify Current Suicidal Plan: Thought about jumping from an escalator today Access to Means: Yes Specify Access to Suicidal Means: Access to escalator What has been your use of drugs/alcohol within the last 12 months?: Alcohol use which began socially two years ago is currently daily with blackouts. Pt consumes from 2 glasses wine up to 7 beers and 4 shots  How many times?: 0 Other Self Harm Risks: None Triggers for Past Attempts: None known Intentional Self Injurious Behavior: Cutting Comment - Self Injurious Behavior: Pt reports she has a history of cutting and stopped 3 years ago Risk to Others: Homicidal Ideation: No Thoughts of Harm to Others: No Current Homicidal Intent: No Current Homicidal Plan: No Access to Homicidal Means: No Identified Victim: None History of harm to others?: No Assessment of Violence: None Noted Violent Behavior Description: Pt denies history of violence Does patient have access to weapons?: No Criminal Charges Pending?: No Does patient have a court date: No Prior Inpatient Therapy: Prior Inpatient Therapy: No Prior Therapy Dates: NA Prior Therapy Facilty/Provider(s): NA Reason for Treatment: NA Prior Outpatient Therapy: Prior Outpatient Therapy: Yes Prior Therapy Dates: 2014 Prior Therapy Facilty/Provider(s): Unknown Reason for Treatment: Depression Does patient have an ACCT team?: No Does patient have Intensive  In-House Services?  : No Does patient have [facility] services? : No Does patient have P4CC services?: No  Level of Care:  OP  Hospital Course:  Gaylon is a 24 year old Caucasian female. Admitted to [facility] as a walk-in admit, medically cleared  at the [facility] ED with complaints of worsening symptoms of depression, alcohol abuse & suicidal ideations.  Nicole Castaneda was admitted for MDD (major depressive disorder) and crisis management.  She was treated discharged with the medications listed below under Medication List.  Medical problems were identified and treated as needed.  Home medications were restarted as appropriate.  Improvement was monitored by observation and [place] daily report of symptom reduction.  Emotional and mental status was monitored by daily self-inventory reports completed by Nicole Castaneda and clinical staff.         Nicole Castaneda was evaluated by the treatment team for stability and plans for continued recovery upon discharge.  Nicole Castaneda motivation was an integral factor for scheduling further treatment.  Employment, transportation, bed availability, health status, family support, and any pending legal issues were also considered during her hospital stay.  She was offered further treatment options upon discharge including but not limited to Residential, Intensive Outpatient, and Outpatient treatment.  Nicole Castaneda will follow up with the services as listed below under Follow Up Information.     Upon completion of this admission the patient was both mentally and medically stable for discharge denying suicidal/homicidal ideation, auditory/visual/tactile hallucinations, delusional thoughts and paranoia.      Consults:  psychiatry  Significant Diagnostic Studies:  labs: per lab  Discharge Vitals:   Blood pressure 116/84, pulse 81, temperature 98.1 F (36.7 C), temperature source Oral, resp. rate 16, height  (1.676 m), weight 58.968 kg (130 lb), last menstrual period 02/05/2015. Body mass index is 20.99 kg/(m^2). Lab Results:   Results for orders placed or performed during the hospital encounter of 02/11/15 (from the past 72 hour(s))  TSH     Status: None   Collection  Time: 02/11/15  7:38 PM  Result Value Ref Range   TSH 0.859 0.350 - 4.500 uIU/mL    Comment: Performed at [facility]  Pregnancy, urine     Status: None   Collection Time: 02/13/15  8:10 PM  Result Value Ref Range   Preg Test, Ur NEGATIVE NEGATIVE    Comment:        THE SENSITIVITY OF THIS METHODOLOGY IS >20 mIU/mL. Performed at [facility]     Physical Findings: AIMS: Facial and Oral Movements Muscles of Facial Expression: None, normal Lips and Perioral Area: None, normal Jaw: None, normal Tongue: None, normal,Extremity Movements Upper (arms, wrists, hands, fingers): None, normal Lower (legs, knees, ankles, toes): None, normal, Trunk Movements Neck, shoulders, hips: None, normal, Overall Severity Severity of abnormal movements (highest score from questions above): None, normal Incapacitation due to abnormal movements: None, normal Patient's awareness of abnormal movements (rate only patient's report): No Awareness, Dental Status Current problems with teeth and/or dentures?: No Does patient usually wear dentures?: No  CIWA:  CIWA-Ar Total: 2 COWS:  COWS Total Score: 2   See Psychiatric Specialty Exam and Suicide Risk Assessment completed by Attending Physician prior to discharge.  Discharge destination:  Home  Is patient on multiple antipsychotic therapies at discharge:  No   Has Patient had three or more failed trials of antipsychotic monotherapy by history:  No    Recommended Plan for Multiple Antipsychotic Therapies:  NA     Medication List    STOP taking these medications        adapalene 0.1 % cream  Commonly known as:  DIFFERIN     clindamycin 1 % gel  Commonly known as:  CLINDAGEL     EPIPEN IJ      TAKE these medications      Indication   buPROPion 150 MG 24 hr tablet  Commonly known as:  WELLBUTRIN XL  Take 1 tablet (150 mg total) by mouth daily.   Indication:  Major Depressive Disorder     busPIRone 5 MG  tablet  Commonly known as:  BUSPAR  Take 1 tablet (5 mg total) by mouth 3 (three) times daily.   Indication:  Generalized Anxiety Disorder     hydrOXYzine 25 MG tablet  Commonly known as:  ATARAX/VISTARIL  Take 1 tablet (25 mg total) by mouth every 4 (four) hours as needed for anxiety (Sleep).   Indication:  Anxiety Neurosis     traZODone 50 MG tablet  Commonly known as:  DESYREL  Take 1 tablet (50 mg total) by mouth at bedtime as needed for sleep.   Indication:  Trouble Sleeping           Follow-up Information    Follow up with [facility] On 02/16/2015.   Why:  at 8:30am for your initial assessment. Please bring your insurance card with you.   Contact information:   [address] [place] [address] [phone]      Follow-up recommendations:  Activity:  as tol, diet as tol  Comments:  1.  Take all your medications as prescribed.              2.  Report any adverse side effects to outpatient provider.                       3.  Patient instructed to not use alcohol or illegal drugs while on prescription medicines.            4.  In the event of worsening symptoms, instructed patient to call 911, the crisis hotline or go to nearest emergency room for evaluation of symptoms.  Total Discharge Time: 40 min  Signed: Velna Hatchet May Agustin AGNP-BC 02/14/2015, 2:46 PM   Patient seen, Suicide Assessment Completed.  Disposition Plan Reviewed

## 2015-02-14 NOTE — Progress Notes (Signed)
Pt reports she has had a good day.  She still has the persistent cough, and is receiving the guaifenesin as ordered.  She denies SI/HI/AVH.  She feels the medications are working for her.  She denies any withdrawal symptoms at this time.  She plans to live with friends at discharge.  [place]. She has been pleasant and cooperative with staff on the unit.  She has been observed interacting appropriately with female peers on the hall.  Support and encouragement offered.  Pt makes her needs known to staff.  Safety maintained with q15 minute checks.

## 2015-02-16 ENCOUNTER — Telehealth (HOSPITAL_COMMUNITY): Payer: Self-pay | Admitting: Licensed Clinical Social Worker

## 2015-02-16 ENCOUNTER — Other Ambulatory Visit (HOSPITAL_COMMUNITY): Payer: BLUE CROSS/BLUE SHIELD | Attending: Psychiatry | Admitting: Licensed Clinical Social Worker

## 2015-02-16 ENCOUNTER — Encounter (HOSPITAL_COMMUNITY): Payer: Self-pay | Admitting: Licensed Clinical Social Worker

## 2015-02-16 DIAGNOSIS — F322 Major depressive disorder, single episode, severe without psychotic features: Secondary | ICD-10-CM

## 2015-02-16 DIAGNOSIS — F1010 Alcohol abuse, uncomplicated: Secondary | ICD-10-CM | POA: Diagnosis not present

## 2015-02-16 DIAGNOSIS — F1020 Alcohol dependence, uncomplicated: Secondary | ICD-10-CM
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
## 2015-02-19 ENCOUNTER — Ambulatory Visit (HOSPITAL_COMMUNITY): Payer: Self-pay | Admitting: Licensed Clinical Social Worker

## 2015-02-19 ENCOUNTER — Encounter (HOSPITAL_COMMUNITY): Payer: Self-pay

## 2015-02-19 NOTE — Progress Notes (Signed)
Adult Psychoeducational Group Note  Date:  02/19/2015 Time:  2:49 PM  Group Topic/Focus: Diagnosis Education:   The focus of this group is to discuss the major disorders that patients maybe diagnosed with.  Group discusses the importance of knowing what one's diagnosis is so that one can understand treatment and better advocate for oneself. Dimensions of Wellness:   The focus of this group is to introduce the topic of wellness and discuss the role each dimension of wellness plays in total health. Medication Education: The focus of this group is to discuss medications, side effects, and management.  Participation Level:  Minimal  Participation Quality:  Appropriate and Resistant  Affect:  Anxious  Cognitive:  Appropriate  Insight: Appropriate  Engagement in Group:  Engaged and Limited  Modes of Intervention:  Activity, Discussion and Education  Additional Comments:  This group will focus on medication education and discussion. Patients will be able to ask questions about their specific medications. Patients will gain information about dosages, side effects, regimens, medication compliance and adherence schedules. Patients will learn about nutrition and the need for a balanced diet as a part of their regular medication schedule. The group emphasizes teaching about psychiatric medications and disorders at the clients' levels of functioning. Interactive group learning is utilized as a Architect to [employer] when patients expressed difficulty with reading and understanding written materials.  Nicole Castaneda 02/19/2015, 2:49 PM       Adult Psychoeducational Group Note  Date:  02/19/2015 Time:  5:04 PM  Group Topic/Focus: Healthy Communication:   The focus of this group is to discuss communication, barriers to communication, as well as healthy ways to communicate with others. Identifying Needs:   The focus of this group is to help patients identify their personal needs that have been  historically problematic and identify healthy behaviors to address their needs.  Participation Level:  Minimal  Participation Quality:  Appropriate  Affect:  Anxious  Cognitive:  Appropriate  Insight: Improving  Engagement in Group:  Engaged and Limited  Modes of Intervention:  Activity, Discussion, Education, Exploration and Socialization  Additional Comments:  Therapist utilized an Nurse, learning disability to identify the social and personal relationships of each group member within his or her own environment. Ecomaps document the connections between family members and the outside world as well as provide a way to visualize the quality of those connections either as positive and nurturing or as negative, a source of stress and/or conflictual. Connections are considered strong or weak. Ecomaps are used to assist group members with discovering possible sources of depression and anxiety as well as uncovering hidden support systems in friends, neighbors, clubs, professional agencies, charities, and social or religious organizations. Each group member was asked to list connections in which they considered a source of support. Therapist accessed for risk of increased depression due to lack of supports and discussed possible resources in which to increase support systems. Therapist increased awareness of the need for a strong support system along the process of rediscovering wellness.        Adult Psychoeducational Group Note  Date:  02/19/2015 Time:  5:06 PM  Group Topic/Focus: Emotional Education:   The focus of this group is to discuss what feelings/emotions are, and how they are experienced. Spirituality:   The focus of this group is to discuss how one's spirituality can aide in recovery.  Participation Level:  Minimal  Participation Quality:  Attentive  Affect:  Appropriate  Cognitive:  Appropriate  Insight: Limited  Engagement in Group:  Limited  Modes of Intervention:  Activity, Discussion,  Education and Support  Additional Comments: Therapist taught group members that in bereavement there are five stages of grief: Denial, Anger, Bargaining, Depression and Acceptance.   Therapist explained that each person spends different lengths of time working through each step and may express each stage with different levels of intensity.  Therapist discussed with the group that the five stages do not necessarily occur in any specific order and that each person may cycle back and forth throughout the stages. Therapist explained that there is no time limit on grief and that each stage may not necessarily be "finished" but "set aside" until acceptance has been reached.  Patients were asked to discuss their experiences with grief whether it be the loss of a loved one; loss of a pet, relationship loss, job loss, etc. Therapist actively listened and provided support as patients reflected on their understandings and experiences with the process of grief.   Nicole Castaneda 02/19/2015, 5:06 PM  Nicole Castaneda 02/19/2015, 5:04 PM

## 2015-02-19 NOTE — Progress Notes (Signed)
Date: 02/19/15 Time of encounter: 2:00 PM - 3:00 PM  Purpose: To address realistic optimism present in recovery. Intervention: Discussed perceived control over events affecting recovery. Affirmed that belief is necessary to accomplish goals effectively. Effect: Nicole Castaneda displayed active recovery principles by asserting confidence and vision are necessary to her recovery process.  [name] CPSS

## 2015-02-20 ENCOUNTER — Other Ambulatory Visit (HOSPITAL_COMMUNITY): Payer: BLUE CROSS/BLUE SHIELD | Admitting: Psychiatry

## 2015-02-20 DIAGNOSIS — F322 Major depressive disorder, single episode, severe without psychotic features: Secondary | ICD-10-CM

## 2015-02-20 NOTE — Progress Notes (Signed)
Date: 02/20/15 Time of encounter: 11:00 AM - 1:30 PM, 2:30 PM - 4:00 PM  Purpose: To address presence of stigma and how it relates to decision making in recovery. Intervention: Discussed present sources of stigma in clients' lives. Engaged in a methodical process of understanding risk-taking versus maintaining a comfort zone. Effect: [name] stated that she exercises strong boundaries within her support system. She added that she currently looks forward to new experiences in recovery.  [name] CPSS

## 2015-02-21 ENCOUNTER — Other Ambulatory Visit (HOSPITAL_COMMUNITY): Payer: BLUE CROSS/BLUE SHIELD | Admitting: Psychiatry

## 2015-02-21 DIAGNOSIS — F322 Major depressive disorder, single episode, severe without psychotic features: Secondary | ICD-10-CM | POA: Diagnosis not present

## 2015-02-21 NOTE — Progress Notes (Signed)
  02/21/2015 12:30 to 4:00 PM  Participation Level: Active  Participation Quality: Appropriate and Attentive  Affect: Euthymic  Cognitive: Alert, Appropriate and Oriented  Insight: Good  Engagement in Therapy: Good  Modes of Intervention: Discussion, Education, Exploration, Rapport Building, Socialization and Support  Summary of Progress/Problems: The main focus of today's process group was discussion stressors and triggers for depression and anxiety. Cognitive restructuring and reframing were used as methods to constructively think about sensory triggers and changes in the body that occur because of stress. Patients were encouraged to think of physical changes in the body due to stress as "my body helping me to meet a challenge". Patients watched and discussed Tresa Endo McGonigal video about transforming stress into a positive experience that encouraged resilience and social connection. Patients participated in the "six simple words exercise" to help develop self-identify and approach recovery process positively. Pt. Discussed current stressors are finding an apartment and looking for a job. Pt. Expressed some relief and confidence that she was making progress and believed that she knew where she was going to be able to get an apartment. Pt. Expressed general discomfort with uncertainty. Pt. Expressed healthy curiosity about the life of sea turtles and set goal to do research on the internet about sea turtles after group. Pt. Actively watched video about transforming stress and stated that it helped her to relax and not "feel so stressed when thinking about my stressors". Pt. 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 Pt. Participated in discussion about musicians and artists and admiration for people who appear to not be  affected by what others think about them.   Shaune Pollack, LPC

## 2015-02-21 NOTE — Progress Notes (Signed)
02/20/15 Initial Psychiatric Assessment / Admit Note to PHP. Duration - 45 minutes   CC-  " I have not had a single drink since I left the hospital"  HPI- 24 year old Single female, college student . Patient reports she had recent psychiatric admission ( 7/17- 02/14/15) related to developing suicidal ideations in the context of heavy drinking.  She had gone to [place] on a [employer]- she states that she had been drinking heavily and daily there. In the context of her heavy drinking she  Became quite depressed and developed suicidal ideations, which resulted in her being sent back. She states " my friends picked me up at the airport and brought me straight to the hospital". She stated she had been drinking heavily over a period of months to years, and stated she suspected she had an alcohol use disorder .  Initially was depressed,  With passive SI, constricted in affect, endorsing neuro-vegetative symptoms of depression. She improved gradually but significantly and upon discharge was euthymic.  At this time mood remains improved and today euthymic. Denies any anhedonia, self esteem has improved, appetite and sleep have improved, no current suicidal ideations. She states she remains sober since her discharge, although she has had some cravings to drink. She ruminates about so much of socializing and partying with friends/meeting people is related to going to bars or parties where people drink. She is insightful about having an alcohol use disorder .   Past Psych. History-  Reports prior history of depression- attributes depression at least partially to poor relationship with parents. One suicide gesture by cutting 3 years ago. Recent psychiatric admission ( 7/17- 02/14/15) for worsening depression in the context of heavy , daily drinking and trip out of the country. No history of mania or hypomania, no history of psychosis, no history of violence, at this time not endorsing PTSD   History. Currently on Wellbutrin XL 150 mgrs QAM and Buspar 5 mgrs TID- denies side effects.    Substance Abuse History - History of  Alcohol Abuse, which has been progressing in severity, and recently was drinking daily and heavily while in [place]. Denies history of  drug abuse . No history of WDL seizures or DTs.  As noted, sober since her admission to inpatient unit on 7/17. No prior history of treatment for alcohol abuse .  Medical History- Denies any medical illnesses-Does not smoke . Denies pregnancy.    Allergies : Allergic to Amoxicillin and to bees   ROS: denies headache, denies ocular issues, denies chest pain, denies shortness of breath, denies nausea, Denies vomiting, denies diarrhea, denies abdominal pain, denies dysuria, denies urgency, denies rash, denies fever, no chills.   Social History- Single, no children, lives independently ,  Currently unemployed, but states she is applying to jobs. She had recently been  in college, but states she has decided to take some time off school and work.  Denies legal issues .Marland Kitchen   Family History- Reports poor relationship with her parents- states they are not supportive and " it is always about them ". She did see them recently and states meeting went " all right, better than I expected ". She is an only child.   Current Medications:  Wellbutrin XL 150 mgrs QAM Buspar  5 mgrs TID  MSE : Alert and attentive, well related, calm no psychomotor restlessness or agitation, speech normal  In tone,volume, rate. She describes Mood  As improved and currently presents euthymic with a full  range of affect . There is  no thought disorder. She denies any suicidal ideations, denies any homicidal ideations, Denies hallucinations and does not appear internally preoccupied, no delusions noted, 0x3. Has insight into alcohol use disorder   Assessment- 24 year old single female, who  Has a history of alcohol abuse, which has been progressing in  severity. Recently , during a trip to [place] , started drinking heavily and daily, and in the context of daily intoxications became severely depressed, suicidal, resulting in having to cut her trip short and needing psychiatric admission from 02/11/15 through 7/20. She is currently sober, abstinent , motivated in treatment. She is experiencing some difficulties in maintaining sobriety related to social interactions with friends usually being in bars or with alcohol present. Mood is now much improved and she is euthymic- suicidal ruminations completely resolved at this time and does not endorse significant neuro-vegetative symptoms of depression. No prior treatment for alcohol use disorder .  Dx.  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  For now Continue medications as highlighted above . Does not need scripts from me at this time.   Nehemiah Massed, MD

## 2015-02-21 NOTE — Progress Notes (Signed)
     Group Progress Note 02/20/15  Program: PHP  Group Time: 1.32pm-2.36pm  Participation Level: Active  Behavioral Response: Appropriate and Sharing  Type of Therapy:  Yoga Skills Group  Summary of Progress: Counselor led group through grounding mat practice for developing relaxation response.  Provided psychoeducation re: yoga and benefit for mental health.  Discussed focus on either breath, movement or sensations in body. Explored use of mantra with breath work.  Processed benefit with group.  Pt expressed some home practice she has been using over past couple of years that she enjoys.  Pt fully participated and expressed enjoying use of mind body work and that all practice was great for her today.    Forde Radon, LPC

## 2015-02-21 NOTE — Progress Notes (Signed)
    Partial Hospitalization Program Type of Therapy:  Group Therapy  02/20/2015   12:30 to 1:30 PM Participation Level:  Active Participation Quality:  Appropriate, Attentive,  Sharing and Supportive  Affect:   Appropriate Cognitive:  Appropriate Insight:  Engaged Engagement in Therapy:  Engaged Modes of Intervention:  Discussion, Education, Exploration, Rapport Building, Socialization and Support  Summary of Progress/Problems: Summary of Progress/Problems: The main focus of today's process group was discussion and identification of cognitive distortions.  Motivational Interviewing was utilized to ask the group members what they get out of their cognitive distortions, and what reasons they may have for wanting to change. Several examples of cognitive distortions were explained using a handout, and patients identified where they currently are with thought distortions and where and they might be willing to challenge their own distortions.  Nicole Castaneda easily engaged in discussion and with minimal guidance was able to process how thought distortions can impact our feelings, behaviors and outcomes. Nicole Castaneda identified her tendency to make of assumptions, based upon presumed negative reactions from others, as her most often used cognitive distortion. Patient was attentive and supportive of others in group.   Nicole Payer Weston, LCSW 02/20/15 at 4:25 PM

## 2015-02-21 NOTE — Progress Notes (Signed)
Date: 02/21/15 Time of encounter: 11:00 AM - 4:00 PM  Purpose: Continued assessment of personal and environmental stressors in recovery. Intervention: Discussed stimulus and triggers currently present in group members' lives. Performed activities to manage, reframe, and rethink stress. Effect: [name] addressed triggers to her anxiety through analyzing and reframing current stressors in her life.  [name] CPSS

## 2015-02-22 ENCOUNTER — Other Ambulatory Visit (HOSPITAL_COMMUNITY): Payer: BLUE CROSS/BLUE SHIELD | Admitting: Psychiatry

## 2015-02-22 DIAGNOSIS — F322 Major depressive disorder, single episode, severe without psychotic features: Secondary | ICD-10-CM

## 2015-02-22 NOTE — Progress Notes (Signed)
Date: 02/22/15 Time of intervention: 11:00 AM - 4:00 PM  Purpose: A continued discussion of strategizing when approaching stressors in recovery. Intervention: Discussed and developed individual strategies through brainstorming and self-expression. Asserted the act of and importance of self-determination in recovery. Effect: Nicole Castaneda recognized negative self-talk as a barrier to her recovery. She asserted that she is methodically preparing a future with optimism.  [name] CPSS

## 2015-02-23 ENCOUNTER — Other Ambulatory Visit (HOSPITAL_COMMUNITY): Payer: BLUE CROSS/BLUE SHIELD | Admitting: Psychiatry

## 2015-02-23 DIAGNOSIS — F322 Major depressive disorder, single episode, severe without psychotic features: Secondary | ICD-10-CM | POA: Diagnosis not present

## 2015-02-23 NOTE — Progress Notes (Signed)
Partial Hospitalization Program Type of Therapy:  Group Therapy  02/22/2015   12:30 to 4:00 PM  Participation Level:  Active  Participation Quality:  Appropriate and Attentive  Affect:   Bright  Cognitive:  Alert, Appropriate and Oriented  Insight:  Developing  Engagement in Therapy:  Good  Modes of Intervention:  Discussion, Education, Exploration, Rapport Building, Socialization and Support  Summary of Progress/Problems: The main focus of today's process group was development of self-esteem.  Pt. 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 Pt. Watched and discussed Amy Cuddy video about use of power poses to encourage confidence and control in social situations and using our bodies in order to change the thought process. Pt. Participated in "I AM" poem exercise and was able to use creativity to discuss aspects of herself. Pt. Participated in magical color shower guided meditation.

## 2015-02-26 ENCOUNTER — Other Ambulatory Visit (HOSPITAL_COMMUNITY): Payer: BLUE CROSS/BLUE SHIELD | Attending: Psychiatry | Admitting: Licensed Clinical Social Worker

## 2015-02-26 DIAGNOSIS — F1010 Alcohol abuse, uncomplicated: Secondary | ICD-10-CM | POA: Insufficient documentation

## 2015-02-26 DIAGNOSIS — F322 Major depressive disorder, single episode, severe without psychotic features: Secondary | ICD-10-CM

## 2015-02-26 DIAGNOSIS — F1020 Alcohol dependence, uncomplicated: Secondary | ICD-10-CM

## 2015-02-26 DIAGNOSIS — F411 Generalized anxiety disorder: Secondary | ICD-10-CM | POA: Insufficient documentation

## 2015-02-26 NOTE — Psych (Signed)
[facility] BH PHP THERAPIST PROGRESS NOTE  Nicole Castaneda [id number]   Date: 02/26/15  Time: 11:00 AM-12:00 PM  Group Topic/Focus:  Bascom Levels, therapist, led group titled "Fair Fighting Rules"  Participation Level:  Active  Participation Quality:  Attentive  Affect:  Appropriate  Cognitive:  Appropriate  Insight: Improving  Engagement in Group:  Engaged  Modes of Intervention:  Activity, Discussion, Education, Exploration, Problem-solving and Coping Skills  Additional Comments: Therapist observed while therapist, Bascom Levels, led group titled "Fair Fighting Rules". She discussed how patients may have come from a background where negative feelings couldn't be expressed and that they may now find it is hard to express these negative feelings.  As an adult we are faced with negative behavior and realize that something has to change.  Fair fighting rules is a style of getting your emotional needs met. It can mean we have to take a risk outside of our comfort zone that helps [place] to make changes. Fair fighting rules helps you to stay in low level feelings to release feelings before they build up into emotional state where your emotions take over. The practice is includes using "I statements", one issue at a time, compromise and negotiate, call "time out if necessary" and watch out for your expectations. It is a practice of being assertive, getting your needs met, and valuing yourself at the same time. [name] related that she has been making an effort to practice being more assertive and needs to continue to practice to improve on this skill.   Suicidal/Homicidal: no  Plan: 1.Patient will implement fair fighting rules to help her to assert herself and get needs met.2.Therapist provide positive reinforcement for effective coping strategies.  [facility] BH PHP THERAPIST GROUP NOTE  Date: 02/26/15 Time: 12:30 PM-2:00 PM   Group Topic/Focus:  Therapist led discussion on strategies to manage  anxiety and introduced mindfulness as a useful strategy.   Participation Level:  Active  Participation Quality:  Attentive and Sharing  Affect:  Appropriate  Cognitive:  Appropriate  Insight: Improving  Engagement in Group:  Engaged  Modes of Intervention:  Discussion, Education, Exploration, Problem-solving and CBT, ACT  Additional Comments: Therapist led discussion on strategies to manage anxiety and introduced mindfulness as a useful strategy. Mindfulness was explained as a means of paying attention in a particular way, on purpose, in the present moment, and non-judgmentally. It helps patients gain perspective, helps to distance ourselves from unhelpful thoughts, detached from emotions, decompress, and ultimately be more effective at handling the problem. One description that was a helpful way to describe it was taking a vacation from self. Another point in discussion was CBT strategy of recognizing cognitive distortions. Therapist pointed out cognitive distortions that lead to anxiety such as "what if." statements and catastrophizing. One strategy mentioned was realizing what is in your control and not in your control and letting go of what you can't control. Another suggestion by patients was to listen to your gut and your instinct and go with this decision. This helps because you put your energy to the action and you don't stay stuck and put all your energy into the perseverating on the decision.  [name] discussed communication with her mom where she explained that her mom continues to be manipulative in order to get her needs met at the expense of patient's needs being heard and being med. Patient explained to group that she is going to do "what is best for me". She related that she is making progress  in group by respecting herself more. She was supported in group for her perspective and therapist explained that she is developmentally appropriate in entering adulthood and figuring out what  makes sense to her in terms of directing her life. Also, that it would be helpful to have a good support group to give her what she needs to support her.   Suicidal/homicidal: no  Plan: 1.Patient use the support of group and feedback to help her in making healthy choices for herself.2.patient take steps to build a healthy lifestyle.   [facility] PHP THERAPIST GROUP NOTE  Date: 02/26/15 Time: 2:00 PM-4:00 PM    Group Topic/Focus:  Therapist explained mindfulness in more detail; Patients watched Ted Talk by Nelida Gores and were led through an exercise in mindfulness; Therapist discussed concepts of acceptance committment therapy  Participation Level:  Active  Participation Quality:  Attentive and Sharing  Affect:  Appropriate  Cognitive:  Appropriate  Insight: Improving  Engagement in Group:  Engaged  Modes of Intervention:  Activity, Discussion, Education, Exploration, Problem-solving and Mindfulness, ACT  Additional Comments: Patients watched video from [employer] by Nelida Gores titled "Practice of Mindfulness". The video explained in more detail the concept of mindfulness and led patients through an exercise where they practiced mindfulness. Therapist reviewed the concept of Acceptance Commitment therapy by explaining that instead of challenging distressing thoughts by looking for evidence and coming up with a more rational response(CBT), in ACT, the thought is accepted as a thought. Patients learned that ACT uses mindfulness to help defuse the thought as well as other techniques such as metaphors and language. Patient learned that the in ACT involves acceptance as well as commitment to values based living. She was encouraged to recognize her own resources in applying coping strategies to finds effective. Patient participated actively in this group by sharing appropriately and listening respectfully when others shared.   Suicidal/Homicidal: no  Plan: 1.Patient use her own resources in  applying coping strategies that she finds effective.2.Patient apply effective coping strategies to life situations.   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 Major depressive disorder, single episode, severe without psychotic features   2. Severe alcohol use disorder       [name],[name] 02/26/2015

## 2015-02-26 NOTE — Progress Notes (Signed)
Date: 02/26/15 Time of encounter: 11:00 AM - 4:00 PM  Purpose: To address a mindfulness-based approach to recovery. Intervention: Stressed that perspective to better approach difficulty can be assisted through living attentively, "in the moment", to combat anxiety. Effect: Nicole Castaneda concluded that her qualities of positivity and curiosity can assist her in a proactive recovery process.  [name] CPSS

## 2015-02-26 NOTE — Progress Notes (Signed)
Date: 02/23/15 Time of encounter: 11:00 AM - 4:00 PM  Purpose: To address stressors within clients' support systems. Intervention: Helped clients identify assets within current support systems. Held a discussion to increase self-care regimens and expand current sources of support. Effect: Nicole Castaneda came to the conclusion that she needs to exercise better communication skills to further understanding within her support system of her condition as a peer.  [name] CPSS

## 2015-02-27 ENCOUNTER — Other Ambulatory Visit (HOSPITAL_COMMUNITY): Payer: BLUE CROSS/BLUE SHIELD | Admitting: Licensed Clinical Social Worker

## 2015-02-27 DIAGNOSIS — F1020 Alcohol dependence, uncomplicated: Secondary | ICD-10-CM

## 2015-02-27 DIAGNOSIS — F322 Major depressive disorder, single episode, severe without psychotic features: Secondary | ICD-10-CM

## 2015-02-27 MED ORDER — BUSPIRONE HCL 5 MG PO TABS: 5.0000 mg | ORAL_TABLET | Freq: Three times a day (TID) | ORAL | Status: DC

## 2015-02-27 NOTE — Progress Notes (Signed)
Date; 02/27/15 Time of encounter: 11 AM - 1:30 PM, 2:30 PM - 4 PM  Purpose: To define and discuss intentional recovery planning. Intervention: Addressed main components of intentional thinking: purpose, obstacles, & acting on goals. Effect: [name] reflected upon the importance of purpose-driven planning for each day, and long-term, in her recovery.  [name] CPSS

## 2015-02-27 NOTE — Progress Notes (Signed)
Nicole Castaneda says the program is working well for her.  She is getting to know herself better and is optimistic about her future, no suicidal thoughts.  No drinking and no desire to drink.  Recalling the [place] experience she believes other things were bothering her and she drank to escape but also recognizes alcohol is a problem for her.  Medications are working well she believes and requested a refill of th Buspar which was done.

## 2015-02-27 NOTE — Psych (Signed)
[facility] BH PHP THERAPIST PROGRESS NOTE  Nicole Castaneda [id number]   Date: 02/27/15  Time: 11:00 AM-12:00 PM  Group Topic/Focus:  Managing Feelings  Participation Level:  Active  Participation Quality:  Attentive and Sharing  Affect:  Appropriate  Cognitive:  Appropriate  Insight: Improving  Engagement in Group:  Engaged  Modes of Intervention:  Discussion, Education, Exploration, Problem-solving, Support and Coping skills  Additional Comments: Group exercise related to patients identifying, expressing and analyzing feelings. Therapist explained that feelings should be processed, not ignored or covered up. You must confront strong feelings openly. If you don't allow feelings to be expressed, they will only leak out in physical, emotional or mental symptoms. Patient is able to recognize that she was drinking because she was suppressing things. She feels that she is taking control of her life and addressing issues she had been suppressing. One of these things is the process of negotiating boundaries with her mom who she feels is manipulative and following her own intuition to make decisions. She was engaged and using group therapeutically to work on her issues.   Suicidal/Homicidal: no  Plan:1.Therapist provide supportive interventions as patient takes positive steps in her life.2.Patient continue to take positive steps to create a healthy lifestyle.   [facility] PHP THERAPIST GROUP NOTE  Date: 02/27/15  Time: 12:30 PM-1:30 PM  Group Topic/Focus:  Daily Recovery Program  Participation Level:  Active  Participation Quality:  Sharing  Affect:  Appropriate  Cognitive:  Appropriate  Insight: Improving  Engagement in Group:  Engaged  Modes of Intervention:  Discussion, Education, Exploration, Problem-solving and Coping Skills  Additional Comments: Group focus related to group discussion of elements of a recovery program. The components identified by group included a support  system, and being strong within yourself to help you take control of your life. Patients also identified needing a purpose in order to be committed to an occupation or to pursuing an education. In order to get to their purpose, patients need to figure out how important certain aspects of their life are. Group discussed giving priority of self-care. Therapist pointed out that it is important to include a daily recovery plan that incorporates elements that will support patients in their recovery and help further them toward their goal. Therapist reviewed with patient how she was doing with abstinence from alcohol and building a foundation for a recovery. She went to one AA meeting and did not feel it was helpful. She feels that she is addressing suppressed feelings that were underlying her drinking. She related that she did have a drink to see how she would do and that she felt less motivation to drink due to her feelings after this experience. Therapist cautioned her on how this can be a trigger and a "slippery slope". My impression is that patient is taking positive steps in addressing feelings she was suppressing but still needs to gain insight on how a daily recovery program will support her in maintaining abstinence.    Suicidal/Homicidal: no  Plan:1.Patient take steps to build a healthy lifestyle.2.Patient learn effective coping strategies to manage symptoms.    [facility] PHP THERAPIST GROUP NOTE  Date:   Time:    Group Topic/Focus:  Peer specialist led group titled "Being intentional and following through with each day".  Participation Level:  Active  Participation Quality:  Attentive and Sharing  Affect:  Appropriate  Cognitive:  Appropriate  Insight: Improving  Engagement in Group:  Engaged  Modes of Intervention:  Discussion, Education,  Exploration and Coping Skills  Additional Comments: Therapist observed while peer specialist led group on group titled "Being intentional and  following through with each day". It involves being directional, anticipating obstacles and intentional in carryout out goals. Patients applied this to their current issues brought up in group. Patient showed insight to purposeful planning to carry out her goals.   Suicidal/Homicidal: no  Plan: 1.Patient use concepts of intentional planning to help her in reaching goals.2.Therapist teach patient effective coping strategies to manage stressors and symptoms.   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 Major depressive disorder, single episode, severe without psychotic features   2. Severe alcohol use disorder       Castaneda,Nicole A 02/27/2015

## 2015-02-28 ENCOUNTER — Other Ambulatory Visit (HOSPITAL_COMMUNITY): Payer: BLUE CROSS/BLUE SHIELD | Admitting: Licensed Clinical Social Worker

## 2015-02-28 DIAGNOSIS — F322 Major depressive disorder, single episode, severe without psychotic features: Secondary | ICD-10-CM | POA: Diagnosis not present

## 2015-02-28 DIAGNOSIS — F1020 Alcohol dependence, uncomplicated: Secondary | ICD-10-CM

## 2015-02-28 NOTE — Progress Notes (Signed)
    Group Progress Note  Program: PHP  02/27/15 Group Time: 1.30pm-2.30pm  Participation Level: Active  Behavioral Response: Appropriate and Sharing  Type of Therapy: Yoga Skills Group  Summary: Counselor led group through energizing mat practice using movement and breath work for relaxation response.   Also used imagery of light for recognizing and connecting with inner strength for coping.  Processed w/ pts practice and their response.  Pt fully participating.  Pt shared that her energy was good today.  Pt chose variations of movements with breath for increasing energy.  Pt reported that he found the practice to very positive for her today and enjoyed.       Forde Radon, LPC

## 2015-02-28 NOTE — Progress Notes (Signed)
Date: 02/28/15 Time of encounter: 1 PM - 4 PM  Purpose: To address a healthy balance between living in society & a solitary existence. Intervention: Discussed supportive roles played in society, healthy in-groups, & having productive relationships with self & others. Effect: [name] recognized the role self-sufficiency plays in her recovery through enacting personal responsibility.  [name] CPSS

## 2015-02-28 NOTE — Psych (Signed)
[facility] BH PHP THERAPIST PROGRESS NOTE  Nicole Castaneda [id number]   Date: 02/28/15  Time: 11:00 AM-12:30 PM   Group Topic/Focus:  Dr. Huel Cote led group titled "Core Issues of Life"  Participation Level:  Active  Participation Quality:  Attentive  Affect:  Appropriate  Cognitive:  Appropriate  Insight: Improving  Engagement in Group:  Engaged  Modes of Intervention:  Activity, Discussion, Education, Exploration and CBT, coping skills  Additional Comments: Therapist observed while Dr. Huel Cote gave group on topic of "Core Issues of Life". He pointed out that the problems that the therapist can help you with are below the surface. He said that patients will see symptoms and reoccurring patterns unless they get to the core of what is causing [place] to do what we do. He used the image of an iceberg to explain core issues underneath the surface. He identified abandonment where a person feels life has left them on the doorstep, abandoned and thrown away or it could abandonment of self. In discussion we discussed solutions that include developing a survivor perspective and holding out hope for life and love. Also CBT was identified as a strategy and that what you think determines where you go. Patient recognizes that one of her core issues is working through abandonment issues. My view is that her respect of self and taking control of her life has been a positive step in treatment to work on this issues. Therapist pointed out another insight brought up from discussion was the insight that addiction is an unhealthy and abnormal relationship with an object or a relationship to control what you can't control. I think that this is useful for patient to understand underlying reasons for addiction to help to work on this as well.   [facility] PHP THERAPIST GROUP NOTE  Date: 02/28/15  Time: 1:00 PM-3:00 PM   Group Topic/Focus:  Peer Specialist led group titled "Living in Society vs a Solitary  Existence"  Participation Level:  Active  Participation Quality:  Attentive  Affect:  Appropriate  Cognitive:  Appropriate  Insight: Improving  Engagement in Group:  Engaged  Modes of Intervention:  Discussion, Education, Exploration and Coping Skills  Additional Comments: Therapist observed while peer specialist led group titled "Living in Society vs a Solitary existence". Patients were asked to reflect on whether they had a healthy relationship with others in society. They also discussed how they can have a healthy relationship with self. We discussed how a healthy relationship with self can be understood as accountability for self, setting a standard for yourself, helps to uplift you and a challenge for you to do better. This point of accountability for self is important insight for patients to use in helping them work on themselves.  We discussed how we have to establish a balance between a healthy relationship with self and others to meet our needs.  We discussed how some of our identify is related to what we have been taught but that part of being an adult is choosing to be a certain way despite what people think. This was an important point as well since a common theme for the patients is working on transitioning to independence as adults. Nicole Castaneda relates that she is working on a balance. She also discussed how in entering in new group she "is herself no matter what". She said if people do not like her then she does not think that she would want this relationship anyway. Patient seems to be taking healthy steps  to development of self.   Suicidal/Homicidal: no  Plan:1.Patient work on developing a healthy balance of healthy relationship with self and other people.2.Patient continue to gain insight to healthy coping strategies.   [facility] PHP THERAPIST GROUP NOTE  Date: 02/28/15  Time: 3:00 PM-4:00 PM  Group Topic/Focus:  Peer Specialist led group on topic of "Living in Socieety vs. a  Solitary Existence and this was tied to strategies to manage anxiety  Participation Level:  Active  Participation Quality:  Attentive and Sharing  Affect:  Appropriate  Cognitive:  Appropriate  Insight: Improving  Engagement in Group:  Engaged  Modes of Intervention:  Discussion, Education and Exploration  Additional Comments: Therapist observed while peer specialist continued topic related to "Living in Society vs a Solitary existence". The topic was tied to strategies to manage anxiety. We discussed how the group was a model of a good support group where they would feel supported, where they could safety express themselves, they could feel accepted but also held accountable. This model will help them in developing a support group outside of the group. The group format has also helped in making patients more aware of negative actions and beliefs as well as recognizing both helpful and unhelpful strategies to address symptoms. Therapist also pointed out how accountability to self was also a part of managing symptoms. Patient able to recognize that progress has been made in recognizing that her "negative assumptions tend to be false". Patient showed good engagement in group through sharing and respectfully listening.   Homicidal/Suicidal: no  Plan: 1.Patient continue to learn and implement effective coping strategies to manage symptoms.2.Patient take steps to develop a healthy lifestyle.   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 Major depressive disorder, single episode, severe without psychotic features   2. Severe alcohol use disorder       Castaneda,Nicole A 02/28/2015

## 2015-03-01 ENCOUNTER — Encounter (HOSPITAL_COMMUNITY): Payer: Self-pay | Admitting: Licensed Clinical Social Worker

## 2015-03-01 ENCOUNTER — Other Ambulatory Visit (HOSPITAL_COMMUNITY): Payer: BLUE CROSS/BLUE SHIELD | Admitting: Licensed Clinical Social Worker

## 2015-03-01 DIAGNOSIS — F1020 Alcohol dependence, uncomplicated: Secondary | ICD-10-CM

## 2015-03-01 DIAGNOSIS — F322 Major depressive disorder, single episode, severe without psychotic features: Secondary | ICD-10-CM | POA: Diagnosis not present

## 2015-03-01 NOTE — Progress Notes (Signed)
Adult Psychoeducational Group Note  Date:  03/01/2015 Time:  5:44 PM  Group Topic/Focus: Dimensions of Wellness:   The focus of this group is to introduce the topic of wellness and discuss the role each dimension of wellness plays in total health.Rediscovering Joy:   The focus of this group is to explore various ways to relieve stress in a positive manner.CBT: The focus of cognitive therapy seeks to change problem behaviors by changing the thoughts that cause and perpetuate them. The focus at first is usually on identifying and challenging dysfunctional/irrational Automatic Thoughts which lead to self-defeating emotional and behavioral responses.    Participation Level:  Minimal  Participation Quality:  Attentive and Sharing  Affect:  Anxious  Cognitive:  Alert  Insight: Appropriate  Engagement in Group:  Engaged  Modes of Intervention:  Activity, Discussion, Exploration and Problem-solving  Additional Comments:  Patients were presented with an activity which identified the CBT process of situation=thoughts=emotions=behaviors. Therapist identified several examples from patients' lives to assist with explanation of the model. Therapist educated clients on how the CBT model can become cyclical based upon the positive or negative behavior displayed by the client. Therapist explored examples of situations and life events to facilitate individual understanding.  Therapist noted that during discussions many of the patients made decisions based on lack of confidence, low self-esteem, and negative past experiences. Therapist gave feedback on individual patient responses. . Therapist encouraged patients to identify and verbalize positive outcomes about their situations. Therapist assisted patients and solicited input from other group members when a member appeared reluctant, despondent or incapable of verbalizing positive outcomes or lack of insight regarding their personal situations.     Nicole Castaneda  was able to connect comments about events in her life and associate them with both past positive and negative behaviors. She was able to identify how her past experiences have shaped her current situations in terms of how she reacts to perceived challenges.    Nicole Castaneda 03/01/2015, 5:44 PM

## 2015-03-01 NOTE — Progress Notes (Signed)
Date: 03/01/15 Time of encounter: 12:30 PM - 2:00 PM  Purpose: To investigate group members' sense of purpose in recovery. Intervention: Group members participated in an exercise on actions they currently enact, followed by a discussion on self-transformation & self-direction. Effect: [name] stated that in working on her recovery, she is finding purpose in fulfilling goals of no negative self-talk, having more confidence, & less worry.  [name] CPSS

## 2015-03-01 NOTE — Psych (Signed)
   [facility] BH PHP THERAPIST PROGRESS NOTE  Nicole Castaneda [id number]   Date: 03/01/15  Time: 11:00 AM-12:00 PM   Group Topic/Focus:  Discussion on reading "Owning Our Power in Relationships". Patients discussed stressors and got supportive and helpful feedback  Participation Level:  Active  Participation Quality:  Appropriate, Attentive and Sharing  Affect:  Appropriate  Cognitive:  Appropriate  Insight: Improving  Engagement in Group:  Engaged  Modes of Intervention:  Activity, Discussion, Education, Problem-solving, Support and Coping Skills  Additional Comments: Therapist introduced a reading and explained that it helps to start the day with some type of ritual that helps you to ease into your day. The title of the reading was "Owning Our Power in Relationships". The group discussion focused on the reflection of practicing using your power to take care of yourself, no matter who you are dealing with, where you are, or what you are doing. Patients discussed that work on themselves in their recovery has helped them in noticing more where they need to take care of their needs and still need to work on taking better care of their needs. The consensus was that it takes practice. Group discussion was also helpful for group members to talk about some of their stressors and get supportive feedback. Patient actively participated in group by actively sharing positive news of getting a new apartment. Therapist gave her positive feedback about seeing her take one step at a time to work on positive goals she is setting for herself.   Suicidal/homicidal: no  Plan: 1.Patient continue to practice using her power by taking care of her needs.2.Patient continue to learn and apply effective coping skills.  [facility] PHP THERAPIST GROUP NOTE  Date: 03/01/15   Time: 12:30 PM-2:00 PM  Group Topic/Focus:  Therapist observed peer specialist led group on finding purpose in recovery  Participation Level:   Active  Participation Quality:  Attentive and Sharing  Affect:  Appropriate  Cognitive:  Appropriate  Insight: Improving  Engagement in Group:  Engaged  Modes of Intervention:  Activity, Discussion, Education, Exploration, Problem-solving and Coping skills  Additional Comments: Therapist observed while peer specialist gave talk on finding purpose in recovery. He utilized Felicity Pellegrini Talk by [place] titled "How to Know Your Life's Purpose in 5 minutes" to help him discuss the topic. The discussion related to how actions lead to self-direction and self-fulfillment. We discussed how an immediate purpose leads to a greater purpose so to look for the valuable lesson in the experience. There are opportunities available in the experience that a person is not aware of that helped to teach the person as well, so be open to valuable lessons that others have to teach. Also, our own teaching to others gives validation of thoughts and beliefs and is empowering. Patient identifies the purpose she has found through becoming more of herself through confidence, less worry and disappearance of negative self-talk. She actively participated through active sharing and respectfully listening.   Suicidal/homicidal: no  Plan: 1.Patient will continue to take positive steps to work toward recovery goals.2.Therapist educate patient on helpful coping strategies and self-awareness to encourage growth.   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 Major depressive disorder, single episode, severe without psychotic features   2. Severe alcohol use disorder       Nicole Castaneda A 03/01/2015

## 2015-03-02 ENCOUNTER — Other Ambulatory Visit (HOSPITAL_COMMUNITY): Payer: BLUE CROSS/BLUE SHIELD | Admitting: Licensed Clinical Social Worker

## 2015-03-02 DIAGNOSIS — F322 Major depressive disorder, single episode, severe without psychotic features: Secondary | ICD-10-CM | POA: Diagnosis not present

## 2015-03-02 DIAGNOSIS — F1020 Alcohol dependence, uncomplicated: Secondary | ICD-10-CM

## 2015-03-02 NOTE — Progress Notes (Signed)
Adult Psychoeducational Group Note  Date:  03/02/2015 Time:  10:12 AM  Group Topic/Focus: Crisis Planning:   The purpose of this group is to help patients create a crisis plan for use upon discharge or in the future, as needed. Early Warning Signs:   The focus of this group is to help patients identify signs or symptoms they exhibit before slipping into an unhealthy state or crisis.  Participation Level:  Active  Participation Quality:  Appropriate and Attentive  Affect:  Anxious  Cognitive:  Alert  Insight: Limited  Engagement in Group:  Developing/Improving  Modes of Intervention:  Discussion, Education, Exploration, Limit-setting, Problem-solving and Support  Additional Comments:  This group focuses on internet safety and how to protect yourself from predators. Therapist discussed the intricacies of online job scams. Therapist educated group members with tips such as: limiting the amount of personal information shared, checking the length of time a website has been established, searching websites for legitimate contact information, sorting through online personal ads, requesting information from private employers, bank scams and establishing a safety network.  Group members were encouraged to delete online accounts, which after becoming informed about scams, they expressed a lack of safety to continue as a member of these sites. Patients were provided with a number of legitimate job search websites and educated on resume writing, submitting a cover letter and how to post a resume that gets you hired.   Nicole Castaneda 03/02/2015, 10:12 AM

## 2015-03-02 NOTE — Psych (Signed)
   [facility] BH PHP THERAPIST PROGRESS NOTE  Nicole Castaneda [id number]   Date: 03/02/15  Time: 11:00 AM-12:30 PM   Group Topic/Focus:  Introduction to Distress Intolerance; Discussion of video by Jacqulyn Cane from [place] [employer] "The Person You Need to [facility]"  Participation Level:  Active  Participation Quality:  Appropriate, Attentive and Sharing  Affect:  Appropriate  Cognitive:  Appropriate  Insight: Improving  Engagement in Group:  Engaged  Modes of Intervention:  Activity, Discussion, Education, Exploration and DBT, coping skills  Additional Comments: Therapist introduced distress intolerance concept to patients. Patient learned that what can get them into trouble are the coping strategies that causes their situation to be worse. [name] recognized both alcohol and suppression as unhealthy strategies she had been using, and now in recovery where she is doing inner work and recognizing the need to use healthier strategies. She is taking steps to build a foundation through work on [employer]. Therapist also showed a video by Filiberto Pinks from [place] [employer] titled "The Person you Need to [facility]". The message of the video is to marry yourself first through a commitment to yourself. Through a commitment to yourself, you are whole and don't need to anyone to make you whole. When you love yourself, you can love people the way they are. [name] sees that through her work in treatment she is making the commitment to herself. The video helps her to see how healthy the rewards of making a commitment to herself.   Suicidal/homicidal: no  Plan: 1.Patient continue to work on healthier coping strategies and do inner work to build a good foundation for her recovery.2.Therapist continue to educate patient on healthy coping strategies.   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 Major depressive disorder,  single episode, severe without psychotic features   2. Severe alcohol use disorder       Abrahm Mancia A 03/02/2015

## 2015-03-02 NOTE — Progress Notes (Signed)
Recreation Therapy Note   Date:  March 02, 2015  Time:  0945  Location: PHP Group Room #2  Group Topic: Relaxation / Mindfulness / Gratitude Journaling  Goals:  Patient will successfully participate in a warm-up T'ai Chi movement to reduce physical tension.   Patient will participate in a MIndfulness exercise using the 5 senses of taste, touch, smell, sight, and sound.   Patient will participate in the experience of "Gratitude Journaling" to elevate mood.  Engagement: Engaged, Active  Additional: Pt appeared very receptive to the concepts taught.  She participated in the T'ai Chi exercise stating that she felt more relaxed and was willing to practice the "[place], [place]" exercise this week.  Pt also participated in the    Mindfulness exercise acknowledging the benefits of being in the present.  Pt also participated in the "Gratitude Journaling" filling 4 pages in the journal provided.  Pt shared that she was thankful for her friends, family, technology, this   program, and for her animals.  Pt stated that she was planning to continue the practice of "Gratitude Journaling" as a way to elevate her mood.  Pt appeared very receptive to treatment.   Gwyndolyn Kaufman, LRT/CTRS

## 2015-03-05 ENCOUNTER — Other Ambulatory Visit (HOSPITAL_COMMUNITY): Payer: BLUE CROSS/BLUE SHIELD | Admitting: Licensed Clinical Social Worker

## 2015-03-05 DIAGNOSIS — F322 Major depressive disorder, single episode, severe without psychotic features: Secondary | ICD-10-CM

## 2015-03-05 DIAGNOSIS — F1020 Alcohol dependence, uncomplicated: Secondary | ICD-10-CM

## 2015-03-05 NOTE — Progress Notes (Signed)
Adult Psychoeducational Group Note  Date:  03/05/2015 Time:  2:41 PM  Group Topic/Focus: Healthy Communication:   The focus of this group is to discuss communication, barriers to communication, as well as healthy ways to communicate with others. Self Esteem Action Plan:   The focus of this group is to help patients create a plan to continue to build self-esteem after discharge.  Participation Level:  Active  Participation Quality:  Appropriate, Sharing and Supportive  Affect:  Appropriate  Cognitive:  Appropriate  Insight: Improving  Engagement in Group:  Developing/Improving  Modes of Intervention:  Discussion, Education and Exploration  Additional Comments:  Therapist attempted to establish rapport with patients through reflective listening and asking permission before providing information or advice. Directive, client-centered, empathic, and motivation-enhancing treatment interventions were utilized. Therapist utilized open-ended questioning to assist patients with exploring their motivation to change these impulsive behaviors. Their responses were processed within the group.  Therapist connected comments to demonstrate to patients that their impulsivity has resulted in a life of instability and negative consequences for themselves and others.   Nicole Castaneda 03/05/2015, 2:41 PM              Adult Psychoeducational Group Note  Date:  03/05/2015 Time:  2:44 PM  Group Topic/Focus: Coping With Mental Health Crisis:   The purpose of this group is to help patients identify strategies for coping with mental health crisis.  Group discusses possible causes of crisis and ways to manage them effectively. Crisis Planning:   The purpose of this group is to help patients create a crisis plan for use upon discharge or in the future, as needed. Relapse Prevention Planning:   The focus of this group is to define relapse and discuss the need for planning to combat relapse. Self Care:   The  focus of this group is to help patients understand the importance of self-care in order to improve or restore emotional, physical, spiritual, interpersonal, and financial health.  CBT: The focus of cognitive therapy seeks to change problem behaviors by changing the thoughts that cause and perpetuate them. The focus at first is usually on identifying and challenging dysfunctional/irrational Automatic Thoughts which lead to self-defeating emotional and behavioral responses.   Participation Level:  Active  Participation Quality:  Attentive  Affect:  Appropriate  Cognitive:  Appropriate  Insight: Appropriate  Engagement in Group:  Developing/Improving and Engaged  Modes of Intervention:  Activity, Clarification, Confrontation, Discussion, Education and Problem-solving  Additional Comments:  Patients were presented with an activity which identified the CBT process of situation=thoughts=emotions=behaviors. Therapist identified several examples from patients' lives to assist with explanation of the model. Therapist educated clients on how the CBT model can become cyclical based upon the positive or negative behavior displayed by the client. Therapist explored examples of situations and life events to facilitate individual understanding.  Therapist noted that during discussions many of the patients made decisions based on lack of confidence, low self-esteem, and negative past experiences. Therapist gave feedback on individual patient responses. . Therapist encouraged patients to identify and verbalize positive outcomes about their situations. Therapist assisted patients and solicited input from other group members when a member appeared reluctant, despondent or incapable of verbalizing positive outcomes or lack of insight regarding their personal situations.    Nicole Castaneda was very helpful in encouraging Nicole Castaneda to discover something her would love to do with his life. She offered several compliments and  identified how funny he is while encouraging him to possibly try out at an open  mic night.   Nicole Castaneda 03/05/2015, 2:44 PM         Adult Psychoeducational Group Note  Date:  03/05/2015 Time:  4:18 PM  Group Topic/Focus: Building Self Esteem:   The Focus of this group is helping patients become aware of the effects of self-esteem on their lives, the things they and others do that enhance or undermine their self-esteem, seeing the relationship between their level of self-esteem and the choices they make and learning ways to enhance self-esteem. Crisis Planning:   The purpose of this group is to help patients create a crisis plan for use upon discharge or in the future, as needed. Developing a Wellness Toolbox:   The focus of this group is to help patients develop a "wellness toolbox" with skills and strategies to promote recovery upon discharge.  Participation Level:  Active  Participation Quality:  Appropriate  Affect:  Appropriate  Cognitive:  Alert and Appropriate  Insight: Improving  Engagement in Group:  Engaged  Modes of Intervention:  Activity, Discussion and Problem-solving  Additional Comments:  Therapist noted that during discussions many of the patients made decisions based on lack of confidence, low self-esteem, reality misperceptions and low energy. Therapist assessed for signs of social withdrawal among patient comments, affect, and group interactions. Therapist encouraged patients to identify and verbalize positive affirmation statements about themselves. Therapist assisted patients and solicited input from other group members when a member appeared reluctant, despondent or incapable of verbalizing positive statements about themselves. Therapist taught patients coping skills to help increase verbalization of positive self-statements and facilitated role-play and practice among group members.  Therapist focused session on exploring, processing, identifying and  reframing self-defeating interpersonal, social and environmental patterns and beliefs.   Nicole Castaneda 03/05/2015, 4:18 PM

## 2015-03-05 NOTE — Progress Notes (Signed)
Date: 03/05/15 Time of encounter: 11 AM - 4 PM  Purpose: To investigate ideas of flexibility in recovery. Intervention: Discussed components of "psychological flexibility": adaptation, changing mental resources, & reframing perspective. Effect: [name] concluded that flexibility in recovery involves capability to, & willingness to, "deviate from the expected plan".  [name] CPSS

## 2015-03-06 ENCOUNTER — Other Ambulatory Visit (HOSPITAL_COMMUNITY): Payer: BLUE CROSS/BLUE SHIELD | Admitting: Psychiatry

## 2015-03-06 DIAGNOSIS — F322 Major depressive disorder, single episode, severe without psychotic features: Secondary | ICD-10-CM | POA: Diagnosis not present

## 2015-03-06 DIAGNOSIS — F1020 Alcohol dependence, uncomplicated: Secondary | ICD-10-CM

## 2015-03-06 NOTE — Progress Notes (Signed)
Adult Psychoeducational Group Note  Date:  03/06/2015 Time:  5:15 PM  Group Topic/Focus: Goals Group:   The focus of this group is to help patients establish daily goals to achieve during treatment and discuss how the patient can incorporate goal setting into their daily lives to aide in recovery. Recovery Goals:   The focus of this group is to identify appropriate goals for recovery and establish a plan to achieve them.  Participation Level:  Active  Participation Quality:  Attentive  Affect:  Appropriate  Cognitive:  Appropriate  Insight: Good  Engagement in Group:  Engaged  Modes of Intervention:  Activity, Clarification, Discussion, Education, Exploration and Problem-solving  Additional Comments:  Therapist introduces the concept of setting S.M. A. R.T. Goals. Each group member was asked to identify a goal and was assisted by the therapist to develop a SMART plan towards achievement. Each member was taught to define, re-evaluate and access each goal to assure achievability. Group members were asked to explore options, pitfalls and obstacles to goal achievement. Therapist assisted each member with problem-solving obstacles and gave feedback on the realistic or achievable measure of each goal's steps. Each member was asked to develop a SMART Goal plan and to explain it during session. Members were offered feedback on how to enhance goal achievement by exploring specifics. Members were encouraged to play card games to develop social skills by explaining rules, game play and scoring throughout the game. The purpose is to assist with development of conversational skills to reduce social anxiety.  Markasia Carrol 03/06/2015, 5:15 PM

## 2015-03-06 NOTE — Progress Notes (Signed)
Date: 03/06/15 Time of encounter: 12:30 PM - 3:00 PM  Purpose: To address incremental steps toward present life goals. Intervention: Discussed current challenges in recovery, weighing options for change logically & realistically. Effect: [name] decided that future living environments (physical & geographical) will be effectively based on previous positive experience.She acknowledged that her recovery is best approached with a multi-faceted method, to ensure better chance of personal success.  [name] CPSS

## 2015-03-06 NOTE — Psych (Signed)
   [facility] BH PHP THERAPIST PROGRESS NOTE  Nicole Castaneda [id number]   Date: 03/06/15  Time: 11:00 AM-12:00 PM   Group Topic/Focus:  Healthy human growth and development and developmental tasks that need accomplished for patients' age; Discussion of ways to find what one enjoys and purpose   Participation Level:  Active  Participation Quality:  Attentive and Sharing  Affect:  Appropriate  Cognitive:  Appropriate  Insight: Improving  Engagement in Group:  Engaged  Modes of Intervention:  Discussion, Education, Exploration and Ego Psychology  Additional Comments: Group topic was about explaining to patients about human growth and development and that each stage of our life there are developmental tasks to accomplish. Group started with a check in. Therapist wanted patients to have more insight about what healthy development is for their stage in life. Therapist explained that for patient's age group healthy goals include working on identify formation, becoming independent from parents and being able to take care of their own needs. Patients discussed ways they could start to figure out their purpose and helpful input from Nicole Castaneda included the suggestion to try different things out that interested them and through this process of experimenting find out more about what they enjoyed to get to their purpose. Nicole Castaneda through discussion shows insight about appropriate developmental goals as well as engaging in healthy behaviors such as working on independence and exploring her purpose and career goals. Her input was helpful in furthering goals of group to figure out ways to find what they enjoyed and their purpose.   Suicidal/Homicidal: no  Plan: 1.Therapist provide positive reinforcement for patient's application of healthy coping skills and positive steps she is taking to complete developmental tasks.2.Patient apply positive coping skills to life situations to make progress on treatment and  life goals.   [facility] PHP THERAPIST GROUP NOTE  Date: 03/06/15  Time: 12:30 PM-2:00 PM  Group Topic/Focus:  Defense Mechanisms; Discussion of healthier strategies to work on issues  Participation Level:  Active  Participation Quality:  Sharing  Affect:  Appropriate  Cognitive:  Appropriate  Insight: Improving  Engagement in Group:  Engaged  Modes of Intervention:  Discussion, Education, Exploration and psychodynamic   Additional Comments: The group topic related to defense mechanisms. Therapist explained the concept that they are automatic, involuntary, usually unconscious psychological activities by which human beings attempt to protect individuals from uncomfortable, negative emotions including anxiety. The group tied the discussion to medications and how they are helpful in getting patients to a place where they are able to work on core issues. The patients pointed out though that medications can help and can open the door but they would have to be actively involved in working on their issues or it was not going to work. Nicole Castaneda identified defenses in place that caused her to not focus on her issues but able to see how medications and healthier coping strategies was helping her to address issues she had suppressed.  Suicidal/Homicidal: no  Plan:1.Nicole Castaneda continue to gain insight to defenses that keep her from working on core issues as well as utilizing treatment to work on core issues that will help improve mental health symptoms.2.Therapist continue to provide positive reinforcement for progress patient is making.      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 Major depressive disorder, single episode, severe without psychotic features   2. Severe alcohol use disorder       Nicole Castaneda A 03/06/2015

## 2015-03-07 ENCOUNTER — Other Ambulatory Visit (HOSPITAL_COMMUNITY): Payer: BLUE CROSS/BLUE SHIELD | Admitting: Licensed Clinical Social Worker

## 2015-03-07 DIAGNOSIS — F322 Major depressive disorder, single episode, severe without psychotic features: Secondary | ICD-10-CM | POA: Diagnosis not present

## 2015-03-07 DIAGNOSIS — F1020 Alcohol dependence, uncomplicated: Secondary | ICD-10-CM

## 2015-03-07 NOTE — Psych (Signed)
   [facility] BH PHP THERAPIST PROGRESS NOTE  Nicole Castaneda [id number]   Date: 03/07/15  Time: 11:00 AM-12:00   Group Topic/Focus:  Goals group; Unhealthy coping; Grief Work  Participation Level:  Active  Participation Quality:  Attentive and Sharing  Affect:  Appropriate  Cognitive:  Appropriate  Insight: Improving  Engagement in Group:  Engaged  Modes of Intervention:  Discussion, Education, Exploration and Coping, Grief work  Additional Comments:  The group focus was on factors that led up to admission to [facility] and progress that patients have made toward goals. [name] feels that she is a healthier person. She said that she does not "say negative things to herself" anymore. She is addressing feelings rather than suppressing them and alcohol assisted her in suppressing them. She had a recent loss and recognizes that she might not have dealt with the loss. 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 She related that she had some sadness but also feelings of frustration and anger that people could not see that he had positive qualities and recognize what happens to a person with mental illness. It made her question her beliefs. Therapist pointed out the healing from grief is letting yourself enter the emotions of grief and talking about your loss so that a healthy response was to talk about her loss. Patient displays willingness to engage in therapeutic processing to aid her in working on herself and help her move toward healthier functioning.    Suicidal/Homicidal: no  Plan:1.Shealynn continue to use healthier coping strategies to manage symptoms.2.Therapist help patient in exploring feelings and educate her on healthier coping strategies.   [facility] BH PHP THERAPIST GROUP NOTE  Date: 03/07/15   Time: 12:30 PM-2:30 PM   Group Topic/Focus:  Establishing Healthy Relationships  Participation Level:   Active  Participation Quality:  Sharing  Affect:  Appropriate  Cognitive:  Appropriate  Insight: Improving  Engagement in Group:  Engaged  Modes of Intervention:  Discussion, Education, Exploration and Psychoeducation-Healthy Relationships  Additional Comments: The group focus was on establishing healthy relationships. Patient admitted that she has a history of unhealthy relationships that causes her to question whether she can be in a healthy relationship. She recognizes that she has trust issues that make it more difficult to be open to a relationship. The feedback for patient was that she has to allow herself to be open to a relationship as she does not want to neglect that aspect of her life. Her career is important focus but also the social aspect of her life. As she has a healthier relationship with herself she also will find that this will allow her to be in healthier relationship with others. Therapist pointed out that the way we talk to ourselves will reflect the way we talk with others. [name] shows willingness to work on herself in order to have a healthy approach to issues in her life.      Suicidal/Homicidal: no  Plan: 1.Patient work on herself while at the same time establishing healthier relationships.2.Therapist continue to work with patient on developing insight to healthy coping strategies.   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 Major depressive disorder, single episode, severe without psychotic features   2. Severe alcohol use disorder       Nicole Castaneda A 03/07/2015

## 2015-03-07 NOTE — Progress Notes (Signed)
02/23/2015   8:00 AM to 1:00  PM Participation Level:  Active Participation Quality:  Appropriate, Attentive, Sharing and Supportive  Affect:   Appropriate Cognitive:  Appropriate Insight:  Engaged Engagement in Therapy:  Engaged Modes of Intervention:  Motivational Interviewing, Discussion, Exploration, Activity and Socialization Summary of Progress/Problems: Summary of Progress/Problems:  Summary of Progress/Problems: The principle focus of today's group was to provide clients the opportunity to process their feelings and concerns related to primary supports in early recovery.  Furthering questions were utilized to assist group members process their feelings and reframing questions implemented to assist clients in looking at circumstances differently.   A closing activity was used to explore where patients see themselves currently and how they would like their weekend to progress.  [name] processed her feelings related to encounter with roommates last evening. Her efforts to advocate for herself and share honestly with her roommates were acknowledged and supported.  She acknowledged need for future conversations and was able to process adjustments for future conversations. She participated willingly in closing activity and shared desire to live more freely. Nicole Payer Croswell, LCSW 02/23/15 at 1:53 PM

## 2015-03-08 ENCOUNTER — Other Ambulatory Visit (HOSPITAL_COMMUNITY): Payer: BLUE CROSS/BLUE SHIELD | Admitting: Licensed Clinical Social Worker

## 2015-03-08 DIAGNOSIS — F322 Major depressive disorder, single episode, severe without psychotic features: Secondary | ICD-10-CM

## 2015-03-08 DIAGNOSIS — F1020 Alcohol dependence, uncomplicated: Secondary | ICD-10-CM

## 2015-03-08 NOTE — Progress Notes (Signed)
Adult Psychoeducational Group Note  Date:  03/08/2015 Time:  4:49 PM  Group Topic/Focus: Recovery Goals:   The focus of this group is to identify appropriate goals for recovery and establish a plan to achieve them. Guided Imagery: The focus of this group is to include simple visualization and direct suggestion using imagery, metaphor and story-telling, fantasy exploration and game playing, dream interpretation, drawing, and active imagination where elements of the unconscious are invited to appear as images that can communicate with the conscious mind.   Participation Level:  Active  Participation Quality:  Appropriate  Affect:  Appropriate  Cognitive:  Appropriate  Insight: Good  Engagement in Group:  Engaged  Modes of Intervention:  Activity, Discussion, Education, Exploration and Problem-solving  Additional Comments: Guided imagery is used to teach psychophysiological relaxation, alleviate anxiety and depression, relieve physical and psychological symptoms, overcome health-endangering habits, resolve conflicts, and help patients prepare for surgery and tolerate procedures more comfortably.  Guided Imagery was used to assist patients with visualization of their goals. Patients were asked to visualize the best work day they've ever had and to identify the type of career they envision themselves doing during this work day. Group members were assisted with developing a contingency plan to pursue the career they'd visualized. Group members were encouraged to verbalize their fears, concerns and stressors regarding pursuit of this new career. Group members were assessed for motivation levels and issues with lack of motivation were addressed. Therapist assisted with problem solving specifics which may interfere with obtaining goals. Group members explored alternative options and asked to list and discuss benefits and consequences of materializing the visualization.   Charlette Hennings 03/08/2015, 4:49  PM

## 2015-03-08 NOTE — Psych (Signed)
   [facility] BH PHP THERAPIST PROGRESS NOTE  Nicole Castaneda [id number]   Date: 03/08/15  Time: 11:00 AM-12:00 PM   Group Topic/Focus:  Application of distress tolerance skills  Participation Level:  Active  Participation Quality:  Attentive, Sharing and Supportive  Affect:  Appropriate  Cognitive:  Appropriate  Insight: Improving  Engagement in Group:  Engaged  Modes of Intervention:  Activity, Discussion, Education, Exploration and DBT  Additional Comments: Group focus was on reviewing concept of distress tolerance and using interventions to help patients apply to their own issues. We reviewed a list of some common coping strategies used by people dealing with the problem and therapist asked patients if any of them applied to them. Nicole Castaneda discussed how we can learn from the past but the time we spend thinking about the past prevents Nicole Castaneda from enjoying the present. She recognizes that our memory distorts the past because we have experienced it, but it can be selective, and we distort the future because it is unknown. We can reframe and realize that we have an opportunity to become a better person and when we realize we distort our view of the future, we can look at it more positively. Nicole Castaneda introduced some of the concepts discussed and reinforced for her a perspective that supports healthy coping strategies.   Suicidal/Homicidal: no  Plan:1.Therapist reinforce healthy healthy strategies to encourage patient in applying them to her life.2.Patient apply healthy coping strategies to life situations.  [facility] BH PHP THERAPIST GROUP NOTE  Date: 03/08/15 Time: 12:30 PM-2:00 PM   Group Topic/Focus:  Using Videos as a tool to reinforce recovery concepts that support growth  Participation Level:  Active  Participation Quality:  Appropriate and Sharing  Affect:  Appropriate  Cognitive:  Appropriate  Insight: Improving  Engagement in Group:  Engaged  Modes of Intervention:   Activity, Discussion, Education, Exploration and coping skills,  Additional Comments: Group focus was on presenting videos that help reinforce for patient's some important concepts that will support them in growth and in their recovery. Some of these concepts discussed by patients include if you want to be successful in accomplishing a goal, it is important to have a plan in place. It helps patients recognize that in working on their goals, they need to think ahead and develop a plan to be successful. Another point was that change is a part of growth and something that is positive. Patients pointed out that it is something that they have to accept to be able to adapt to life.  A person can have a growth mindset where they believe that their basic abilities are not fixed but can be developed and can change.  This view creates resilience and a view where a person views challenges and failures as opportunities to grow. This view helps patient's to be adaptable and supports them in growth. Patient was active in sharing that indicates insight to concepts discussed and willingness to apply to her life.  Suicidal/Homicidal: no  Plan: 1.Patient apply healthy perspectives that recognize change as growth to her life situations.2.Therapist continue to educate patient on healthy coping strategies.    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 Major depressive disorder, single episode, severe without psychotic features   2. Severe alcohol use disorder       [name],[name] 03/08/2015

## 2015-03-09 ENCOUNTER — Other Ambulatory Visit (HOSPITAL_COMMUNITY): Payer: BLUE CROSS/BLUE SHIELD | Admitting: Licensed Clinical Social Worker

## 2015-03-09 DIAGNOSIS — F1020 Alcohol dependence, uncomplicated: Secondary | ICD-10-CM

## 2015-03-09 DIAGNOSIS — F322 Major depressive disorder, single episode, severe without psychotic features: Secondary | ICD-10-CM | POA: Diagnosis not present

## 2015-03-09 NOTE — Progress Notes (Signed)
Recreation Therapy Note   Date: 03/09/15  Time: 0945-11:00  Location: PHP Group Room #2  Objectives: Patient will participate in relaxation technique of Qigong and will verbalize positive benefits.   Patient will complete handout on negative beliefs one has about oneself; patient will then identify    strengths, talents, and gifts one has; patient will identify actions to take to accept oneself as whole and   complete.  Observations:  Patient actively participated in all the exercises presented.  Pt verbalized that she felt more    relaxed and calm after the Qigong exercise.  Pt had no trouble in identifying her imperfections    which included being overly excited, judgmental, and easily annoyed.  Pt then identified her    strengths as being curious, adventurous, open, and optimistic.  Pt was able to identify events    when she used these positive traits.       Pt shared that she would be willing to work on gratitude journaling and mindfulness this week.    Pt was acknowledged for her willingness to take on new ways to increase her quality of life and    appeared very receptive to treatment.   Gwyndolyn Kaufman, LRT/CTRS

## 2015-03-09 NOTE — Psych (Signed)
   [facility] BH PHP THERAPIST PROGRESS NOTE  Nicole Castaneda [id number]   Date: 03/09/15   Time: 11:00 AM-12:00 PM  Group Topic/Focus:  Group focus through discussion was on trauma and family conflict  Participation Level:  Active  Participation Quality:  Attentive and Sharing  Affect:  Appropriate  Cognitive:  Appropriate  Insight: Improving  Engagement in Group:  Engaged  Modes of Intervention:  Discussion, Education, Exploration, Limit-setting, Support and Coping Strategies  Additional Comments: Group focus related to the topic of trauma and family conflict. [name] discussed how past trauma has impacted her trust and past functioning. In session she was open to openly to discuss the event without psychological distress and my impression is that she has made good progress in eliminating the impact it has had on her functioning although she still has trust issues. She identified relationship with her mom as one of the major contributor to her problems and she has actively worked in treatment on finding healthy ways to resolve issues with her relationship. Therapist pointed out that she is taking positive steps toward independence that are age appropriate and healthy. She is also working on establishing boundaries with her mom to help her to take care of herself. Therapist was able to reframe so she was able to see the benefits of boundaries and establishing independence. Therapist also pointed out that by taking control of her life she has found a healthy strategy to manage issues.   Suicidal/homicidal: no  Plan. 1.Patient use therapy to to gain insight to healthy ways to develop healthy coping strategies to manage conflict with mom and past trauma. Marland Kitchen2.Therapist reinforce healthy coping strategies.    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 Major depressive disorder, single episode, severe without  psychotic features   2. Severe alcohol use disorder       [name],[name] 03/09/2015

## 2015-03-10 NOTE — Progress Notes (Addendum)
03/06/15  Medication Management Note   Duration - 25 minutes   Subjective - at this time patient is doing well. She states she remains sober. Her mood is "OK", and at present denies significant neuro-vegetative symptoms of depression or any SI. She is facing two significant stressors. One issue is that some of her friends have started excluding her from their plans because " they know I can't drink and they want to drink". She states " it should be OK for me to hang out with them even if they are drinking, but they get all mysterious about it". This has led to tension in her relationship with some of her friends. She actually recently moved out from the place she was living with roommates and is now living alone . Another  More chronic stressor is strained relationship with mother. States parents recently came to visit her and " mom made it all about herself , and could only talk about how she feels ".  She states she went to an [facility] but " did not really like it too much, it seemed kind of cultish". We reviewed her concerns , impressions .  Objective -  I have met with patient and have also discussed case with therapists. Patient is doing well. She seems highly motivated in recovery and abstinence at this time. This is leading to some tension with friends, as most of them drink , go to bars, etc, and patient describes some of them seem to be avoiding her now. In spite of these stressors, states she is feeling better, and today minimizes depression , denies any SI.  She is tolerating medications well and denies medication side effects. She is functioning well in daily activities and at this time is future oriented, looking into finding a job . Denies cravings for alcohol. Denies any drug abuse .    Current Medications- Wellbutrin XL 150 mgrs QAM, Buspar 5 mgrs TID, Trazodone 50 mgrs QHS PRN Insomnia   ROS- denies headache, no seizures, no chest pain, no shortness of breath, no tremors .    MSE - She is alert and attentive, well related, well groomed , good eye contact, speech normal,  Mood seems euthymic, affect full in range, smiles often and appropriately ,no thought disorder, denies any SI or any HI, no Hallucinations, no delusions , not internally preoccupied. O x 3   Assessment-  Patient improved compared to admission status- at this time not endorsing depression and presenting euthymic, with full range of affect. Motivated in sobriety, and has not relapsed. Having some difficulties with her friends, support system in regards to some of them avoiding her now, as they know she does not drink. This has been difficult and painful for her , but does not seem to be causing depression at this time. She is tolerating medications well. Denies side effects.    Plan- continue PHP. Continue medications as highlighted above . Patient interested in going to [facility] groups .     Neita Garnet , MD

## 2015-03-12 ENCOUNTER — Other Ambulatory Visit (HOSPITAL_COMMUNITY): Payer: BLUE CROSS/BLUE SHIELD | Admitting: Licensed Clinical Social Worker

## 2015-03-12 DIAGNOSIS — F322 Major depressive disorder, single episode, severe without psychotic features: Secondary | ICD-10-CM

## 2015-03-12 DIAGNOSIS — F1020 Alcohol dependence, uncomplicated: Secondary | ICD-10-CM

## 2015-03-12 MED ORDER — BUSPIRONE HCL 5 MG PO TABS: 5.0000 mg | ORAL_TABLET | Freq: Three times a day (TID) | ORAL | Status: DC

## 2015-03-12 MED ORDER — BUPROPION HCL ER (XL) 150 MG PO TB24: 150.0000 mg | ORAL_TABLET | Freq: Every day | ORAL | Status: DC

## 2015-03-12 NOTE — Psych (Signed)
   [facility] BH PHP THERAPIST PROGRESS NOTE  Christopher Hink [id number]   Date: 03/12/15  Time: 11:00 AM-12:30 PM  Group Topic/Focus:  Balance of Body, Mind and Spirit  Participation Level:  Active  Participation Quality:  Sharing  Affect:  Appropriate  Cognitive:  Appropriate  Insight: Improving  Engagement in Group:  Engaged  Modes of Intervention:  Activity, Discussion, Education, Exploration and Coping Skills  Additional Comments: The focus of today's group was balance of body, mind and spirit. Patients discussed how attending to the body, mind and spirit is essential for personal growth, health and healing. Therapist explained that caring for our body includes sound nutrition, exercise and health monitoring. Caring for our mind means being aware of and tending to our thoughts, attitudes, beliefs and values.Our mind can bring [place] great joy and discernment or be the major source of worry and pain. Caring for our spirit includes appreciating and developing our emotional aspects and achieving greater connection to our inner self. Patient assessed and identified ways she has made progress while in the partial program to take better care of her mind, body and spirit. In particular, she identified an app that she uses called "[employer]" that locates dscounts and programs in the area for the mind and body.   Suicidal/Homicidal: no  Plan: 1.Patient continue to take steps to take care of and create a balance for her mind, body and spirit.2.Patient take steps to great a healthy lifestyle.  [facility] BH PHP THERAPIST GROUP NOTE  Date: 03/12/15  Time: 1:00 PM-3:00 PM  Group Topic/Focus:  Recovery Goals:   The focus of this group is to identify appropriate goals for recovery and establish a plan to achieve them.  Participation Level:  Active  Participation Quality:  Attentive and Sharing  Affect:  Appropriate  Cognitive:  Appropriate  Insight: Improving  Engagement in Group:   Engaged  Modes of Intervention:  Discussion, Education, Exploration and Coping Skills  Additional Comments: The topic of group was discussion of recovery goals. Patient discussed that she has enrolled in classes so she can explore and find out what her interests are and what she wants pursue as a career.  Patient also discussed her plans to travel and to visit schools. She wants to enroll in a school that would be a good fit for her. Patient shows growth, improvement in mood, motivation and future orientation as she sets positive goals for herself in her life.  She has made progress on treatment goals as she has only had one drink since she has been in the program and she describes this experience as awful. She has to work through conflict with peer group who she explains reports have not given her the support she needed. She recognized that the issue was poor communication and she addressed the conflict through making efforts to have clearer communication. She shows progress in implementing positive coping skills.    Suicidal/Homicidal: no  Plan: 1.Patient continue to take positive steps to work on recovery goals.2.Patient take steps to build a healthy lifestyle.   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 Major depressive disorder, single episode, severe without psychotic features   2. Severe alcohol use disorder       Castaneda,Nicole A 03/12/2015

## 2015-03-12 NOTE — Progress Notes (Signed)
03/12/15  Medication Management Note   Duration - 25 minutes   Subjective - patient continues to do well, and at this time denies feeling depressed. She denies medication side effects, and feels the combination of Wellbutrin XL /Buspar has been effective . Of note, states she is not taking Trazodone any longer because it caused her to feel overmedicated. She reports ongoing sobriety, abstinence from alcohol and at this time denies cravings for alcohol .   Objective - I have met with patient and have also discussed case with therapists.  As noted, she continues to do  Well and report from staff is that she has presented with improved mood and seems euthymic. Patient denies any lingering depressive symptoms- denies anhedonia, denies sadness,  reports good  sense of self esteem, reports normal appetite and sleep. She had been thinking of taking some time off college and working but states she thought about it and re enrolled in college last week. She is looking forward to starting classes. Relationship with friends has improved- it had initially been tense related to some of her friends avoiding her /ostracizing her regarding her being abstinent from alcohol. She is motivated in sobriety and at this time denies cravings . She went to one AA meeting and was ambivalent about it but today states " I think I am going to go to another meeting and try it out".  Denies medication side effects. Vitals  Today- BP 100/80  We have reviewed side effect profile for medications - she is aware of potential for Wellbutrin to cause seizures- she has no history of seizure disorder .    Current Medications- Wellbutrin XL 150 mgrs QAM, Buspar 5 mgrs TID  ROS-Acne, for which she is on topical Abx treatment ,  no  headache, no seizures, no chest pain, no shortness of breath, no tremors .   MSE - She is alert and attentive, well related, well groomed , good eye contact, speech normal, Mood euthymic, affect full  in range, reactive  ,no thought disorder, denies any SI or any HI, no hallucinations, no delusions , not internally preoccupied. O x 3 . She is future oriented .  Assessment- Patient is currently doing very well- she is euthymic, with a full range of affect , and has no neuro-vegetative symptoms of depression. She has tolerated Wellbutrin XL well. She remains sober, in spite of difficulties with her friends/support system as above, and remains motivated in sobriety at this time. As discussed with staff and patient , current plan is to D/C from [facility] later this week .    Plan- scheduled to complete PHP later this week. Renew medications as highlighted above , via e script to her pharmacy. ( One month/one refill) . Encouraged patient to participate in [place] or in [facility] Meetings .     Neita Garnet , MD

## 2015-03-13 ENCOUNTER — Other Ambulatory Visit (HOSPITAL_COMMUNITY): Payer: BLUE CROSS/BLUE SHIELD | Admitting: Licensed Clinical Social Worker

## 2015-03-13 DIAGNOSIS — F1020 Alcohol dependence, uncomplicated: Secondary | ICD-10-CM

## 2015-03-13 DIAGNOSIS — F322 Major depressive disorder, single episode, severe without psychotic features: Secondary | ICD-10-CM
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
## 2015-03-14 ENCOUNTER — Other Ambulatory Visit (HOSPITAL_COMMUNITY): Payer: Self-pay

## 2015-03-15 ENCOUNTER — Other Ambulatory Visit (HOSPITAL_COMMUNITY): Payer: Self-pay

## 2015-03-16 ENCOUNTER — Other Ambulatory Visit (HOSPITAL_COMMUNITY): Payer: Self-pay

## 2015-03-16 ENCOUNTER — Ambulatory Visit (INDEPENDENT_AMBULATORY_CARE_PROVIDER_SITE_OTHER): Payer: BLUE CROSS/BLUE SHIELD | Admitting: Psychiatry

## 2015-03-16 ENCOUNTER — Encounter (HOSPITAL_COMMUNITY): Payer: Self-pay | Admitting: Psychiatry

## 2015-03-16 VITALS — BP 114/75 | HR 81 | Ht 66.0 in | Wt 135.0 lb

## 2015-03-16 DIAGNOSIS — F1010 Alcohol abuse, uncomplicated: Secondary | ICD-10-CM

## 2015-03-16 DIAGNOSIS — F321 Major depressive disorder, single episode, moderate: Secondary | ICD-10-CM | POA: Diagnosis not present

## 2015-03-16 DIAGNOSIS — F411 Generalized anxiety disorder: Secondary | ICD-10-CM

## 2015-03-16 NOTE — Progress Notes (Signed)
Psychiatric Initial Adult Assessment   Patient Identification: Nicole Castaneda MRN:  [id number] Date of Evaluation:  03/16/2015 Referral Source: [facility] partial hospital Chief Complaint:   depression and anxiety Visit Diagnosis:    ICD-9-CM ICD-10-CM   1. Major depressive disorder, single episode, moderate 296.22 F32.1   2. Alcohol abuse 305.00 F10.10   3. Generalized anxiety disorder 300.02 F41.1    Diagnosis:   Patient Active Problem List   Diagnosis Date Noted  . Major depressive disorder, single episode, severe without psychotic features [F32.2]   . MDD (major depressive disorder) [F32.2] 02/11/2015  . Alcohol abuse [F10.10] 02/11/2015   History of Present Illness:  24 year old white single female transferred from partial hospital. She was discharged from there on 03/12/18.  Patient states prior to partial that she was hospitalized at [facility] inpatient unit in July 2016 after she returned from [place]. For detox and suicidal ideation she had been drinking heavily and up to 8 beers per day. She was then transferred to partial hospital and discharged from there.  Patient states that she went to [place] for study abroad program studying apart tied history. She began drinking excessively there and also made statements that she wanted to die, so after 11 days of staying there she was sent back to the [place]. Patient felt very depressed and so brought herself to the inpatient unit.  Patient reports that she is doing well since her discharge from the partial hospital where she has learned coping skills and worked on improving her self-esteem. She is presently on Wellbutrin XL 150 mg by mouth daily, BuSpar 5 mg 3 times a day and trazodone 50 mg by mouth when necessary for insomnia. States that she has been doing well since discharge.  Sleep and appetite are good mood has been stable denies feeling anxious denies feeling hopeless or helpless no anhedonia, no suicidal  or homicidal ideation and no hallucinations or delusions. States that she has not smoked cigarettes and has not used alcohol and no marijuana.  Patient is currently not dating anyone and was sexually active last month using condoms. Her last menstrual period was last week.  Associated Signs/Symptoms: Depression Symptoms:  psychomotor retardation, anxiety, (Hypo) Manic Symptoms:  None Anxiety Symptoms:  Mild anxiety Psychotic Symptoms:  None PTSD Symptoms: None  Past Medical History: Scoliosis and acne Past Medical History  Diagnosis Date  . Scoliosis     Past Surgical History  Procedure Laterality Date  . Ganglion cyst removed from left arm    . Mole removal Left   . Wisdom tooth extraction    . Top piece of ring finger detached     Family History: Parents have a history of alcohol use and abuse Family History  Problem Relation Age of Onset  . Alcohol abuse Mother   . Alcohol abuse Father   . Diabetes Maternal Grandmother    Social History:  Patient lives by herself in an apartment in [place] and goes to [employer] Social History   Social History  . Marital Status: Single    Spouse Name: N/A  . Number of Children: N/A  . Years of Education: N/A   Social History Main Topics  . Smoking status: Light Tobacco Smoker  . Smokeless tobacco: None  . Alcohol Use: Yes     Comment: "2 glasses of wine, sometimes binge drinking"  . Drug Use: No  . Sexual Activity: Yes   Other Topics Concern  . None   Social  History Narrative     Musculoskeletal: Strength & Muscle Tone: within normal limits Gait & Station: normal Patient leans: N/A  Psychiatric Specialty Exam: HPI  Review of Systems  Constitutional: Negative for fever, chills, weight loss, malaise/fatigue and diaphoresis.  HENT: Negative for ear discharge, ear pain, hearing loss, nosebleeds and tinnitus.   Eyes: Negative for blurred vision, double vision, photophobia, pain, discharge and redness.  Respiratory:  Negative for cough, hemoptysis, sputum production, shortness of breath and wheezing.   Cardiovascular: Negative for chest pain, palpitations, orthopnea, claudication, leg swelling and PND.  Gastrointestinal: Negative for heartburn, nausea, vomiting, abdominal pain, diarrhea, constipation, blood in stool and melena.  Genitourinary: Negative for dysuria, urgency, frequency, hematuria and flank pain.  Musculoskeletal: Negative for myalgias, back pain, joint pain, falls and neck pain.  Skin: Negative for itching and rash.  Neurological: Negative for dizziness, tingling, tremors, sensory change, speech change, focal weakness, seizures, loss of consciousness, weakness and headaches.  Endo/Heme/Allergies: Negative for environmental allergies and polydipsia. Does not bruise/bleed easily.  Psychiatric/Behavioral: Positive for depression and substance abuse. Negative for suicidal ideas, hallucinations and memory loss. The patient is nervous/anxious. The patient does not have insomnia.     Blood pressure 114/75, pulse 81, height 5\' 6"  (1.676 m), weight 135 lb (61.236 kg).Body mass index is 21.8 kg/(m^2).  General Appearance: Casual  Eye Contact:  Good  Speech:  Clear and Coherent and Normal Rate  Volume:  Normal  Mood:  Stable  Affect:  Appropriate and Full Range  Thought Process:  Goal Directed, Linear and Logical  Orientation:  Full (Time, Place, and Person)  Thought Content:  Rumination  Suicidal Thoughts:  No  Homicidal Thoughts:  No  Memory:  Immediate;   Good Recent;   Good Remote;   Good  Judgement:  Good  Insight:  Good  Psychomotor Activity:  Normal  Concentration:  Good  Recall:  Good  Fund of Knowledge:Good  Language: Good  Akathisia:  No  Handed:  Right  AIMS (if indicated):  0  Assets:  Communication Skills Desire for Improvement Financial Resources/Insurance Housing Physical Health Resilience Social Support Transportation  ADL's:  Intact  Cognition: WNL  Sleep:  Good     Is the patient at risk to self?  No. Has the patient been a risk to self in the past 6 months?  Yes Has the patient been a risk to self within the distant past?  No. Is the patient a risk to others?  No. Has the patient been a risk to others in the past 6 months?  No. Has the patient been a risk to others within the distant past?  No.  Allergies:  Bee venom and amoxicillin Allergies  Allergen Reactions  . Bee Venom Anaphylaxis  . Amoxicillin Rash    Childhood allergy   Current Medications: Current Outpatient Prescriptions  Medication Sig Dispense Refill  . adapalene (DIFFERIN) 0.1 % gel Apply topically at bedtime.    Marland Kitchen buPROPion (WELLBUTRIN XL) 150 MG 24 hr tablet Take 1 tablet (150 mg total) by mouth daily. 30 tablet 1  . busPIRone (BUSPAR) 5 MG tablet Take 1 tablet (5 mg total) by mouth 3 (three) times daily. 90 tablet 1  . clindamycin (CLEOCIN) 75 MG/5ML solution Take by mouth once.    Marland Kitchen EPINEPHRINE IJ Inject as directed.    . hydrOXYzine (ATARAX/VISTARIL) 25 MG tablet Take 1 tablet (25 mg total) by mouth every 4 (four) hours as needed for anxiety (Sleep). (Patient not taking: Reported on  03/12/2015) 30 tablet 0   No current facility-administered medications for this visit.    Previous Psychotropic Medications: No   Substance Abuse History in the last 12 months:  Yes.    Consequences of Substance Abuse: Medical Consequences:  Patient was hospitalized and had to go through detox Family Consequences:  Patient had to quit her study abroad program  Medical Decision Making:  Established Problem, Stable/Improving (1), Review of Psycho-Social Stressors (1), Review or order clinical lab tests (1), Review and summation of old records (2), Review of Last Therapy Session (1) and Review of Medication Regimen & Side Effects (2)  Treatment Plan Summary: Medication management Maj. depression single Continue Wellbutrin XL 150 mg by mouth every morning. Generalized anxiety  disorder Continue BuSpar 5 mg by mouth 3 times a day Insomnia Continue trazodone 50 mg by mouth when necessary 50% of the session was spent in discussing  CBT,Thought Blocking, Exposure Desensitization, Relaxation, Cognitive Restructuring of Cognitive Distortions, STP Techniques for Impulsivity, Anger Management, Social Skills Training,and abstinence from alcohol.  Labs   none   Return to clinic. to see Dr. Michae Kava in 6 weeks, call sooner if necessary

## 2015-03-19 ENCOUNTER — Other Ambulatory Visit (HOSPITAL_COMMUNITY): Payer: Self-pay

## 2015-03-20 ENCOUNTER — Ambulatory Visit (INDEPENDENT_AMBULATORY_CARE_PROVIDER_SITE_OTHER): Payer: BLUE CROSS/BLUE SHIELD | Admitting: Clinical

## 2015-03-20 ENCOUNTER — Other Ambulatory Visit (HOSPITAL_COMMUNITY): Payer: Self-pay

## 2015-03-20 ENCOUNTER — Encounter (HOSPITAL_COMMUNITY): Payer: Self-pay | Admitting: Clinical

## 2015-03-20 DIAGNOSIS — F1020 Alcohol dependence, uncomplicated: Secondary | ICD-10-CM | POA: Diagnosis not present

## 2015-03-20 DIAGNOSIS — F331 Major depressive disorder, recurrent, moderate: Secondary | ICD-10-CM | POA: Diagnosis not present
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
## 2015-03-21 ENCOUNTER — Other Ambulatory Visit (HOSPITAL_COMMUNITY): Payer: Self-pay

## 2015-03-22 ENCOUNTER — Other Ambulatory Visit (HOSPITAL_COMMUNITY): Payer: Self-pay

## 2015-03-23 ENCOUNTER — Other Ambulatory Visit (HOSPITAL_COMMUNITY): Payer: Self-pay

## 2015-03-26 ENCOUNTER — Other Ambulatory Visit (HOSPITAL_COMMUNITY): Payer: Self-pay

## 2015-03-27 ENCOUNTER — Other Ambulatory Visit (HOSPITAL_COMMUNITY): Payer: Self-pay

## 2015-03-28 ENCOUNTER — Other Ambulatory Visit (HOSPITAL_COMMUNITY): Payer: Self-pay

## 2015-03-29 ENCOUNTER — Other Ambulatory Visit (HOSPITAL_COMMUNITY): Payer: Self-pay

## 2015-04-18 ENCOUNTER — Ambulatory Visit (INDEPENDENT_AMBULATORY_CARE_PROVIDER_SITE_OTHER): Payer: BLUE CROSS/BLUE SHIELD | Admitting: Clinical

## 2015-04-18 DIAGNOSIS — F1020 Alcohol dependence, uncomplicated: Secondary | ICD-10-CM | POA: Diagnosis not present

## 2015-04-18 DIAGNOSIS — F331 Major depressive disorder, recurrent, moderate: Secondary | ICD-10-CM | POA: Diagnosis not present

## 2015-04-24 ENCOUNTER — Encounter (HOSPITAL_COMMUNITY): Payer: Self-pay | Admitting: Clinical

## 2015-04-24 NOTE — Progress Notes (Signed)
   THERAPIST PROGRESS NOTE  Session Time: 9:00 -9:57  Participation Level: Active  Behavioral Response: CasualAlertNA  Type of Therapy: Individual Therapy  Treatment Goals addressed: improve psychiatric symptoms, Elevate mood (increased confidence and willing to try new things), improve unhelpful though patterns (reduction in time spent ruminating), relapse prevention skills  Interventions: CBT and Motivational Interviewing, Grounding and Mindfulness Techniques  Summary: Nicole Castaneda is a 24 y.o. female who presents with   Suicidal/Homicidal: No -without intent/plan  Therapist Response:  Nicole Castaneda met with clinician for an individual session. Nicole Castaneda discussed her psychiatric symptoms, her goals for therapy, and her current life events. Nicole Castaneda shared that she would like to work towards not needing therapy. She shared that she understands that her alcohol use was a problem and that she wants to improve her thought process so that she is not depressed. Nicole Castaneda shared the she was feeling better taking the medication. She also shared that she had drank one time since last session. Client and clinician discussed events that led to her drinking. She shared that she did so with out thinking about it. She shared that she only drank a glass and then did not drink after. Client and clinician discussed alcohol dependence . Client and clinician began a discussion on relapse prevention and triggers. Nicole Castaneda said that she was planning to a tanned and AA meeting before next session. Nicole Castaneda shared that she would like to attend a meeting that had more young people in it. Clinician's suggested that she attend a meeting and ask others about meetings that would have more young people in it. Clinician introduced grounding and mindfulness techniques. clinician discussed the practice purpose and application of the techniques. Client and clinician discussed some of the techniques and detail and practice some  of the techniques together. Clinician introduced some basic CBT concepts. Nicole Castaneda agreed to practice the grounding and mindfulness techniques daily until next session. Nicole Castaneda agreed to complete a packet on depression before next session and bring it with her and bring at next session.   Plan: Return again in 1-2 weeks.  Diagnosis: Axis I: Major Depressive Disorder, recurrent, moderate and alcohol use disorder, moderate, dependence    Powell,Frances A, LCSW 04/24/2015

## 2015-04-25 ENCOUNTER — Telehealth (HOSPITAL_COMMUNITY): Payer: Self-pay | Admitting: Licensed Clinical Social Worker

## 2015-04-26 ENCOUNTER — Ambulatory Visit (INDEPENDENT_AMBULATORY_CARE_PROVIDER_SITE_OTHER): Payer: BLUE CROSS/BLUE SHIELD | Admitting: Clinical

## 2015-04-26 ENCOUNTER — Encounter (HOSPITAL_COMMUNITY): Payer: Self-pay | Admitting: Clinical

## 2015-04-26 DIAGNOSIS — F1020 Alcohol dependence, uncomplicated: Secondary | ICD-10-CM

## 2015-04-26 DIAGNOSIS — F331 Major depressive disorder, recurrent, moderate: Secondary | ICD-10-CM

## 2015-04-26 NOTE — Progress Notes (Signed)
   THERAPIST PROGRESS NOTE  Session Time: 1:31 - 2:30  Participation Level: Active  Behavioral Response: CasualAlertNA  Type of Therapy: Individual Therapy  Treatment Goals addressed: improve psychiatric symptoms, Elevate mood (increased confidence and willing to try new things), improve unhelpful though patterns (reduction in time spent ruminating), relapse prevention skills  Interventions: CBT and Motivational Interviewing, Grounding and Mindfulness Techniques  Summary: Nicole Castaneda is a 24 y.o. female who presents with   Suicidal/Homicidal: No -without intent/plan  Therapist Response:  [name] met with clinician for an individual session. [name] discussed her psychiatric symptoms,  her current life events and her homework. [name] stated that she had not drank any alcohol since last session. Client and clinician talked a bit about having a plan to say no to alcohol prior to being offered. [name] shared that she had attended a concert and had gone on a date both without alcohol. She shared that she felt really good about this. Clinician validated her feelings and efforts. Client and clinician discussed how the more she goes out and does things without alcohol the more comfortable it will be. [name] also discussed her relationship with her parents and how it affected her mood.  [name] shared that she had practiced her grounding and mindfulness techniques partially this past week. Client and  clinician reviewed some of the takes techniques and [name] stated that she would practice him this week. [name] had completed her cbt packet. [name] shared her thoughts and insights A about her homework and agreed to complete another packet prior to next session. [name] shared that she had not yet attended an [place] meeting which was one of her goals from last week but plan to do so this week.   Plan: Return again in 1 -2 weeks.  Diagnosis: Axis I: Major Depressive Disorder,  recurrent, moderate and alcohol use disorder, moderate, dependence   Powell,Frances A, LCSW 04/26/2015

## 2015-05-01 ENCOUNTER — Ambulatory Visit (INDEPENDENT_AMBULATORY_CARE_PROVIDER_SITE_OTHER): Payer: BLUE CROSS/BLUE SHIELD | Admitting: Psychiatry

## 2015-05-01 ENCOUNTER — Encounter (HOSPITAL_COMMUNITY): Payer: Self-pay | Admitting: Psychiatry

## 2015-05-01 VITALS — BP 112/80 | HR 68 | Ht 66.0 in | Wt 134.4 lb

## 2015-05-01 DIAGNOSIS — F411 Generalized anxiety disorder: Secondary | ICD-10-CM | POA: Diagnosis not present

## 2015-05-01 DIAGNOSIS — F322 Major depressive disorder, single episode, severe without psychotic features: Secondary | ICD-10-CM

## 2015-05-01 MED ORDER — BUPROPION HCL ER (XL) 150 MG PO TB24: 150.0000 mg | ORAL_TABLET | Freq: Every day | ORAL | Status: DC

## 2015-05-01 MED ORDER — BUSPIRONE HCL 5 MG PO TABS: 5.0000 mg | ORAL_TABLET | Freq: Three times a day (TID) | ORAL | Status: DC

## 2015-05-01 NOTE — Progress Notes (Signed)
[facility] [id number] Progress Note  Nicole Castaneda [id number] 24 y.o.  05/01/2015 10:46 AM  Chief Complaint:"good"  History of Present Illness: States she is doing well. Pt has not had any alcohol in 2 weeks. At that point in time she had one glass of wine. Pt is very proud of herself.  Today pt denies any concerns.   Depression is present but significantly improved. Pt has sad mood once a week for about 1-2 hrs. She treats it by getting active and distracting herself. Denies isolation, crying spells, an[place]donia, worthlessness and hopelessness.   Sleeping about 8-9 hrs/night. Pt is not taking Vistaril and Trazodone. Pt took one dose of Trazodone but didn't like the next day fatigue. Energy, appetite and concentration are good.   Anxiety is mild and tolerable. Pt is able to deal with it.  Taking Wellbutrin and Buspar as prescribed and denies SE.   Suicidal Ideation: No Plan Formed: No Patient has means to carry out plan: No  Homicidal Ideation: No Plan Formed: No Patient has means to carry out plan: No  Review of Systems: Psychiatric: Agitation: No Hallucination: No Depressed Mood: No Insomnia: No Hypersomnia: No Altered Concentration: No Feels Worthless: No Grandiose Ideas: No Belief In Special Powers: No New/Increased Substance Abuse: No Compulsions: No  Neurologic: Headache: No Seizure: No Paresthesias: No  Review of Systems  Constitutional: Negative for fever, chills and weight loss.  HENT: Negative for congestion, ear pain, nosebleeds and sore throat.   Eyes: Negative for blurred vision, double vision, pain and redness.  Respiratory: Negative for cough, sputum production and wheezing.   Cardiovascular: Negative for chest pain, palpitations and leg swelling.  Gastrointestinal: Negative for heartburn, nausea, vomiting and abdominal pain.  Musculoskeletal: Negative for back pain, joint pain and neck pain.  Skin: Negative for itching and rash.   Neurological: Negative for dizziness, tremors, seizures, loss of consciousness, weakness and headaches.  Psychiatric/Behavioral: Negative for depression, suicidal ideas, hallucinations and substance abuse. The patient is not nervous/anxious and does not have insomnia.      Past Medical Family, Social History: living in apartment alone. Pt is taking one class at [facility]. 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 Single with no kids. Parents are in [place] and she doesn't visit them. Only child.  reports that she has never smoked. She has never used smokeless tobacco. She reports that she drinks alcohol. She reports that she does not use illicit drugs.  Family History  Problem Relation Age of Onset  . Alcohol abuse Mother   . Alcohol abuse Father   . Diabetes Maternal Grandmother     Past Medical History  Diagnosis Date  . Scoliosis   . Depression   . Alcohol abuse     Outpatient Encounter Prescriptions as of 05/01/2015  Medication Sig  . adapalene (DIFFERIN) 0.1 % gel Apply topically at bedtime.  Marland Kitchen buPROPion (WELLBUTRIN XL) 150 MG 24 hr tablet Take 1 tablet (150 mg total) by mouth daily.  . busPIRone (BUSPAR) 5 MG tablet Take 1 tablet (5 mg total) by mouth 3 (three) times daily.  . clindamycin (CLEOCIN) 75 MG/5ML solution Take by mouth once.  Marland Kitchen EPINEPHRINE IJ Inject as directed.  . hydrOXYzine (ATARAX/VISTARIL) 25 MG tablet Take 1 tablet (25 mg total) by mouth every 4 (four) hours as needed for anxiety (Sleep). (Patient not taking: Reported on 05/01/2015)  . traZODone (DESYREL) 50 MG tablet Take 50 mg by mouth  at bedtime.   No facility-administered encounter medications on file as of 05/01/2015.    Past Psychiatric History/Hospitalization(s): Anxiety: No Bipolar Disorder: No Depression: Yes Mania: No Psychosis: No Schizophrenia: No Personality Disorder: No Hospitalization for psychiatric illness: Yes [facility] in July for  depression and SI then did PHP at [facility].  History of Electroconvulsive Shock Therapy: No Prior Suicide Attempts: No denies current access to guns.   Physical Exam: Constitutional:  BP 112/80 mmHg  Pulse 68  Ht  (1.676 m)  Wt 134 lb 6.4 oz (60.963 kg)  BMI 21.70 kg/m2  General Appearance: alert, oriented, no acute distress  Musculoskeletal: Strength & Muscle Tone: within normal limits Gait & Station: normal Patient leans: N/A  Mental Status Examination/Evaluation: Objective: Attitude: Calm and cooperative  Appearance: Fairly Groomed and Neat, appears to be stated age  Eye Contact::  Good  Speech:  Clear and Coherent and Normal Rate  Volume:  Normal  Mood:  euthymic  Affect:  Full Range  Thought Process:  Goal Directed, Linear and Logical  Orientation:  Full (Time, Place, and Person)  Thought Content:  Negative  Suicidal Thoughts:  No  Homicidal Thoughts:  No  Judgement:  Good  Insight:  Good  Concentration: good  Memory: Immediate-good Recent-good Remote-good  Recall: fair  Language: fair  Gait and Station: normal  [employer] of Knowledge: average  Psychomotor Activity:  Normal  Akathisia:  No  Handed:  Right  AIMS (if indicated):  n/a   Assets:  Manufacturing systems engineer Desire for Improvement Financial Resources/Insurance Housing Leisure Time Physical Health Resilience Social Support Veterinary surgeon Decision Making (Choose Three): Established Problem, Stable/Improving (1), Review of Psycho-Social Stressors (1), Review or order clinical lab tests (1) and Review of Medication Regimen & Side Effects (2)  Assessment: Axis I: MDD- single, severe without psychotic features; GAD  Axis II: deferred     Plan:   Medication management with supportive therapy. Risks/benefits and SE of the medication discussed. Pt verbalized understanding and verbal consent obtained for treatment.  Affirm with the patient  that the medications are taken as ordered. Patient expressed understanding of how their medications were to be used.  -improvement of symptoms Continue Wellbutrin XL 150 mg by mouth every morning for depression Continue BuSpar 5 mg by mouth 3 times a day for depression and anxiety D/c Trazodone  D/C Vistaril  Labs: reviewed K 3.3, ALT 13, CBC WNL, TSH WNL, UDS negative; EKG NSR, QTc 391  Therapy: brief supportive therapy provided. Discussed psychosocial stressors in detail.   Validated and encouraged pt's sobriety Encouraged to continue individual therapy with Tomma Lightning Pt is going to start going to AA meetings this week  Pt denies SI and is at an acute low risk for suicide.Patient told to call clinic if any problems occur. Patient advised to go to ER if they should develop SI/HI, side effects, or if symptoms worsen. Has crisis numbers to call if needed. Pt verbalized understanding.  F/up in 2 months or sooner if needed   Oletta Darter, MD 05/01/2015

## 2015-05-02 ENCOUNTER — Encounter (HOSPITAL_COMMUNITY): Payer: Self-pay | Admitting: Clinical

## 2015-05-02 ENCOUNTER — Ambulatory Visit (INDEPENDENT_AMBULATORY_CARE_PROVIDER_SITE_OTHER): Payer: BLUE CROSS/BLUE SHIELD | Admitting: Clinical

## 2015-05-02 DIAGNOSIS — F1020 Alcohol dependence, uncomplicated: Secondary | ICD-10-CM

## 2015-05-02 DIAGNOSIS — F331 Major depressive disorder, recurrent, moderate: Secondary | ICD-10-CM | POA: Diagnosis not present

## 2015-05-02 NOTE — Progress Notes (Signed)
   THERAPIST PROGRESS NOTE  Session Time:  1:30 - 2:25  Participation Level: Active  Behavioral Response: NeatAlertNA  Type of Therapy: Individual Therapy  Treatment Goals addressed: improve psychiatric symptoms, improve unhelpful though patterns (reduction in time spent ruminating), relapse prevention skills  Interventions: CBT and Motivational Interviewing, Grounding and Mindfulness Techniques  Summary: Myliah Medel is a 24 y.o. female who presents with   Suicidal/Homicidal: No -without intent/plan  Therapist Response:  [name] met with clinician for an individual session. [name] discussed her psychiatric symptoms, and her current life events. She shared that she did not drink any alcohol this past week and that she feels like her medication is starting to work for her. She shared that she did go to the bar with a friend but had made her mind up prior to going that she would not drink any alcohol. Client and clinician discussed relapse prevention skills including avoiding hanging out where alcohol is in abundance. [name] shared that she would like to attend an [place] meeting but has not. [name] stated that she had completed her homework packet. Client and clinician reviewed the and discussed the packet. [name] shared her thoughts and insights a bout the packet and her self. [name] shared a bout where some of her thoughts and beliefs were formed. Jordan Hawks and clinician discussed how this how her beliefs a affected her tendency to ruminate. [name] shared her insight that her ruminating may be a misguided attempt to fix things. Client and clinician had a discussion a bout how to challenge unhelpful thought patterns.  [name] shared that she continues to practice her grounding techniques when she remembers [name] shared which grounding techniques worked for her as well as some of the modifications that she has made herself to make the grounding techniques more personable to  her.   Plan: Return again in 1 -2 weeks.  Diagnosis: Axis I: Major Depressive Disorder, recurrent, moderate and alcohol use disorder, moderate, dependence   Angeldejesus Callaham A, LCSW 05/02/2015

## 2015-05-16 ENCOUNTER — Ambulatory Visit (INDEPENDENT_AMBULATORY_CARE_PROVIDER_SITE_OTHER): Payer: BLUE CROSS/BLUE SHIELD | Admitting: Clinical

## 2015-05-16 ENCOUNTER — Encounter (HOSPITAL_COMMUNITY): Payer: Self-pay | Admitting: Clinical

## 2015-05-16 DIAGNOSIS — F1020 Alcohol dependence, uncomplicated: Secondary | ICD-10-CM

## 2015-05-16 DIAGNOSIS — F331 Major depressive disorder, recurrent, moderate: Secondary | ICD-10-CM | POA: Diagnosis not present

## 2015-05-16 NOTE — Progress Notes (Signed)
   THERAPIST PROGRESS NOTE  Session Time: 1:33 - 2:30  Participation Level: Active  Behavioral Response: CasualAlertPositive  Type of Therapy: Individual Therapy  Treatment Goals addressed: improve psychiatric symptoms, Elevate mood (increased confidence and willing to try new things), improve unhelpful though patterns (reduction in time spent ruminating), relapse prevention skills  Interventions: CBT and Motivational Interviewing,   Summary: Jazara Swiney is a 24 y.o. female who presents with   Suicidal/Homicidal: No -without intent/plan  Therapist Response:  [name] met with clinician for an individual session. [name] discussed her psychiatric symptoms,  her current life events and her homework.  [name] shared that she had not drank alcohol since last session. [name] shared about the skills she used to avoid triggers. [name] shared that she tried to go to a [place] meeting that it was held at [employer] and she ended up in the worship rather than an [place] meeting. She shared that she would try again. [name] had completed her homework packet and client and clinician discussed and reviewed the packet. [name] shared her thoughts and insights a bout how her rules and assumptions were affecting her ability to be enjoying be present in her life. [name] agreed to continue her homework and tell next session. [name] used samples from her life and client and clinician discussed how those samples related to what was in the workbook. [name] identified some of her negative automatic thinking patterns such as black-and-white thinking she also shared how it has affected her relationships and increased the amount of time she spent ruminating. Client and clinician discussed how to challenge unhelpful thought patterns. [name] formulated alternative ways to view the situations. [name] shared that she continues to practice her grounding and mindfulness techniques.   Plan: Return again in 1 -2  weeks.  Diagnosis: Axis I: Major Depressive Disorder, recurrent, moderate and alcohol use disorder, moderate, dependence   Columbia Pandey A, LCSW 05/16/2015

## 2015-05-23 ENCOUNTER — Ambulatory Visit (INDEPENDENT_AMBULATORY_CARE_PROVIDER_SITE_OTHER): Payer: BLUE CROSS/BLUE SHIELD | Admitting: Clinical

## 2015-05-23 ENCOUNTER — Encounter (HOSPITAL_COMMUNITY): Payer: Self-pay | Admitting: Clinical

## 2015-05-23 DIAGNOSIS — F331 Major depressive disorder, recurrent, moderate: Secondary | ICD-10-CM

## 2015-05-23 DIAGNOSIS — F1020 Alcohol dependence, uncomplicated: Secondary | ICD-10-CM

## 2015-05-23 NOTE — Progress Notes (Signed)
   THERAPIST PROGRESS NOTE  Session Time: 11:57 - 12:58  Participation Level: Active  Behavioral Response: NeatAlertNA  Type of Therapy: Individual Therapy  Treatment Goals addressed: improve psychiatric symptoms, Elevate mood (increased confidence and willing to try new things), improve unhelpful though patterns (reduction in time spent ruminating), relapse prevention skills  Interventions: CBT and Motivational Interviewing, Grounding and Mindfulness Techniques  Summary: Nicole Castaneda is a 24 y.o. female who presents with   Suicidal/Homicidal: No -without intent/plan  Therapist Response:  Nicole Castaneda met with clinician for an individual session. Nicole Castaneda discussed her psychiatric symptoms, and her current life events. Nicole Castaneda shared that she has felt better this past week. She shared that she has not drank any alcohol since the last session. Client and clinician discussed the skills she has been using to avoid triggers to drink. She shared that she had completed her homework. Client and clinician reviewed and discussed her homework packet. Nicole Castaneda shared that she had been working to identify and challenge some of her rules and assumptions. She shared that she had chosen to do this in regards to her relationships. She shared that  she was working on having more acceptance of others than judging them based on her internal rules. She shared that she was finding that this exercise has helped her to be happier over the past week. Client and clinician discussed how she was applying her cbt skills to challenge her thoughts. Nicole Castaneda shared that she continues to use her grounding and mindfulness skills. She agreed to complete another packet before next session as well as practice her grounding and mindfulness techniques.   Plan: Return again in 1 -2 weeks.  Diagnosis: Axis I: Major Depressive Disorder, recurrent, moderate and alcohol use disorder, moderate, dependence   Havyn Ramo A,  LCSW 05/28/2015

## 2015-06-06 ENCOUNTER — Ambulatory Visit (HOSPITAL_COMMUNITY): Payer: Self-pay | Admitting: Clinical

## 2015-06-13 ENCOUNTER — Ambulatory Visit (HOSPITAL_COMMUNITY): Payer: Self-pay | Admitting: Clinical

## 2015-06-14 ENCOUNTER — Ambulatory Visit (INDEPENDENT_AMBULATORY_CARE_PROVIDER_SITE_OTHER): Payer: BLUE CROSS/BLUE SHIELD | Admitting: Clinical

## 2015-06-14 ENCOUNTER — Encounter (HOSPITAL_COMMUNITY): Payer: Self-pay | Admitting: Clinical

## 2015-06-14 DIAGNOSIS — F411 Generalized anxiety disorder: Secondary | ICD-10-CM | POA: Diagnosis not present

## 2015-06-14 DIAGNOSIS — F1020 Alcohol dependence, uncomplicated: Secondary | ICD-10-CM

## 2015-06-14 DIAGNOSIS — F331 Major depressive disorder, recurrent, moderate: Secondary | ICD-10-CM | POA: Diagnosis not present

## 2015-06-14 NOTE — Progress Notes (Signed)
   THERAPIST PROGRESS NOTE  Session Time: 8:05 -9:00  Participation Level: Active  Behavioral Response: NeatAlertNA  Type of Therapy: Individual Therapy  Treatment Goals addressed: improve psychiatric symptoms, Elevate mood (increased confidence and willing to try new things), improve unhelpful though patterns (reduction in time spent ruminating), relapse prevention skills  Interventions: CBT and Motivational Interviewing, Grounding and Mindfulness Techniques  Summary: Nicole Castaneda is a 24 y.o. female who presents with   Suicidal/Homicidal: No -without intent/plan  Therapist Response:  Nicole Castaneda met with clinician for an individual session. Nicole Castaneda discussed her psychiatric symptoms, and her current life events. Nicole Castaneda shared that she had been working for the [employer] and was very disappointed about the outcome but energized to do more volunteer work with justice and [employer]. She reports that she has not drank since last session. She shared that she only had on awkward situation where her Animator took her out to a bar but she opted for water instead. She shared she felt really good about making this decision for herself. Client and clinician discussed the importance of planning ahead and watching out for her best interest. Clinician asked what were the benefits of her not drinking. Nicole Castaneda shared that she has more clarity, her relationships are improving, more money, and she feels healthier. Client and clinician discussed the importance of focusing on the benefits rather than what she might feel she is missing. Nicole Castaneda shared that this is what she has been working for. She shared that she has a lot of things she would like to do and that while others might be able to drink and get them done, she is viewing it as an either or choice, and that drinking would take her in the opposite direction from her desires. Client and clinician discussed and reviewed her  homework. She share that she has noticed that she is able to put to use the skills learned in therapy and reinforced in the packets. Client and clinician discussed the skills she has been using. She shared that she continues to grow in her acceptance of others and to challenge her thoughts that focus on basing how she feels about her self on others responses. She shared that she is more aware of her own behaviors that get in the way of her desired outcomes.  She shared she will be spending Thanksgiving at her Grandparents and does not believe that will be a trigger to drink. She also shared that she has registered for school and is excited about her upcoming return to classes. Nicole Castaneda agreed to continue to practice her grounding and mindfulness techniques as well as complete another cbt packet.   Plan: Return again in 1 -2 weeks.  Diagnosis: Axis I: Major Depressive Disorder, recurrent, moderate and alcohol use disorder, moderate, dependence    Niccolas Loeper A, LCSW 06/14/2015

## 2015-06-27 ENCOUNTER — Ambulatory Visit (INDEPENDENT_AMBULATORY_CARE_PROVIDER_SITE_OTHER): Payer: BLUE CROSS/BLUE SHIELD | Admitting: Clinical

## 2015-06-27 ENCOUNTER — Encounter (HOSPITAL_COMMUNITY): Payer: Self-pay | Admitting: Clinical

## 2015-06-27 DIAGNOSIS — F1020 Alcohol dependence, uncomplicated: Secondary | ICD-10-CM

## 2015-06-27 DIAGNOSIS — F331 Major depressive disorder, recurrent, moderate: Secondary | ICD-10-CM

## 2015-06-27 NOTE — Progress Notes (Signed)
   THERAPIST PROGRESS NOTE  Session Time: 8:00 -8:58  Participation Level: Active  Behavioral Response: NeatAlertDepressed  Type of Therapy: Individual Therapy  Treatment Goals addressed: improve psychiatric symptoms, Elevate mood , improve unhelpful though patterns (reduction in time spent ruminating), relapse prevention skills  Interventions: CBT and Motivational Interviewing, Grounding and Mindfulness Techniques  Summary: Nicole Castaneda is a 24 y.o. female who presents with   Suicidal/Homicidal: No -without intent/plan  Therapist Response:  Nicole Castaneda met with clinician for an individual session. Nicole Castaneda discussed her psychiatric symptoms, and her current life events. Nicole Castaneda shared that she was feeling very depressed and upset because of interactions with her mother. She shared that her mother had talked her into getting a cat and that she was enjoying bonding with her mother over the cat. Then it turned out that Nicole Castaneda was allergic to the cat and reached out to her mother and others to help her find a home for the cat. The mother responded poorly, leaving Nicole Castaneda to feel as if her mother did not care for her but only the cat. Client and clinician discussed [name]'s emotions and her negative automatic thoughts about herself. Client and clinician discussed the evidence for and against the thoughts. Nicole Castaneda shed a few tears when she had the insight that she had wanted her mother to respond with empathy. She shared that she did not like herself for continuing to want something from her that is not available. Client and clinician discussed the fact that it is normal to want that from the people you love. Client and clinician discussed the fact that her mother loves her imperfectly. Nicole Castaneda had the insight that if she knew that was what she was wanting from her mother before hand she could have reached out to others that are more likely to give it to her. She shared that should she  interact with her mother, she could do so in a way that she did not expect things that her mother is unavailable to give. Nicole Castaneda and clinician discussed the fact that Nicole Castaneda is worthy of love, support,  empathy, and all such things and that whether or not someone else is available to offer those things does not change the fact that she is worthy of them. Nicole Castaneda shared about the many people in her life that do offer her love, support and empathy. Nicole Castaneda shared that she has continued to abstain from alcohol. Client and clinician discussed continuing to use her relapse prevention skills during stressful periods. Client and clinician finished with a grounding technique. Nicole Castaneda agreed to continue her homework until next session.  Plan: Return again in 1 -2 weeks.  Diagnosis: Axis I: Major Depressive Disorder, recurrent, moderate and alcohol use disorder, moderate, dependence   Arwyn Besaw A, LCSW 06/27/2015

## 2015-07-03 ENCOUNTER — Ambulatory Visit (INDEPENDENT_AMBULATORY_CARE_PROVIDER_SITE_OTHER): Payer: BLUE CROSS/BLUE SHIELD | Admitting: Psychiatry

## 2015-07-03 ENCOUNTER — Encounter (HOSPITAL_COMMUNITY): Payer: Self-pay | Admitting: Psychiatry

## 2015-07-03 VITALS — BP 118/78 | HR 81 | Ht 66.0 in | Wt 130.6 lb

## 2015-07-03 DIAGNOSIS — F322 Major depressive disorder, single episode, severe without psychotic features: Secondary | ICD-10-CM

## 2015-07-03 DIAGNOSIS — F411 Generalized anxiety disorder: Secondary | ICD-10-CM

## 2015-07-03 MED ORDER — BUPROPION HCL ER (XL) 150 MG PO TB24: 150.0000 mg | ORAL_TABLET | Freq: Every day | ORAL | Status: DC

## 2015-07-03 MED ORDER — BUSPIRONE HCL 5 MG PO TABS: 5.0000 mg | ORAL_TABLET | Freq: Three times a day (TID) | ORAL | Status: DC

## 2015-07-03 NOTE — Progress Notes (Signed)
Patient ID: [name], female   DOB: 07-10-1991, 24 y.o.   MRN: [id number]  [facility] [id number] Progress Note  Brittinie Wherley [id number] 25 y.o.  07/03/2015 2:07 PM  Chief Complaint:"good but a little stressed"  History of Present Illness: States she is doing well. Pt has not had any alcohol since August. Pt is not going to [employer]   Pt will be starting classes at [employer] in Jan. She is a little worried but excited.   Today pt denies any concerns.   Depression is present but significantly improved. Pt has sad mood once a week for about 1-2 hrs. She treats it by getting active and distracting herself. States she is really busy so doesn't have time to be depressed. Denies isolation, crying spells, anehdonia, worthlessness and hopelessness.   Sleeping about 8-9 hrs/night. Pt is not taking Vistaril and Trazodone.  Energy, appetite and concentration are good.   Anxiety is resolved.  Taking Wellbutrin and Buspar as prescribed and denies SE.   Suicidal Ideation: No Plan Formed: No Patient has means to carry out plan: No  Homicidal Ideation: No Plan Formed: No Patient has means to carry out plan: No  Review of Systems: Psychiatric: Agitation: No Hallucination: No Depressed Mood: No Insomnia: No Hypersomnia: No Altered Concentration: No Feels Worthless: No Grandiose Ideas: No Belief In Special Powers: No New/Increased Substance Abuse: No Compulsions: No  Neurologic: Headache: No Seizure: No Paresthesias: No  Review of Systems  Constitutional: Negative for fever, chills and weight loss.  HENT: Negative for congestion, ear pain, nosebleeds and sore throat.   Eyes: Negative for blurred vision, double vision, pain and redness.  Respiratory: Negative for cough, sputum production and wheezing.   Cardiovascular: Negative for chest pain, palpitations and leg swelling.  Gastrointestinal: Negative for heartburn, nausea, vomiting and abdominal pain.   Musculoskeletal: Negative for back pain, joint pain and neck pain.  Skin: Negative for itching and rash.  Neurological: Negative for dizziness, tremors, seizures, loss of consciousness, weakness and headaches.  Psychiatric/Behavioral: Negative for depression, suicidal ideas, hallucinations and substance abuse. The patient is not nervous/anxious and does not have insomnia.      Past Medical Family, Social History: living in apartment alone. Pt is taking one class at [employer]. 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 Single with no kids. Parents are in [place] and she doesn't visit them. Only child.  reports that she has never smoked. She has never used smokeless tobacco. She reports that she does not drink alcohol or use illicit drugs.  Family History  Problem Relation Age of Onset  . Alcohol abuse Mother   . Alcohol abuse Father   . Diabetes Maternal Grandmother     Past Medical History  Diagnosis Date  . Scoliosis   . Depression   . Alcohol abuse     Outpatient Encounter Prescriptions as of 07/03/2015  Medication Sig  . adapalene (DIFFERIN) 0.1 % gel Apply topically at bedtime.  Marland Kitchen buPROPion (WELLBUTRIN XL) 150 MG 24 hr tablet Take 1 tablet (150 mg total) by mouth daily.  . busPIRone (BUSPAR) 5 MG tablet Take 1 tablet (5 mg total) by mouth 3 (three) times daily.  . clindamycin (CLEOCIN) 75 MG/5ML solution Take by mouth once.  Marland Kitchen EPINEPHRINE IJ Inject as directed.  . etonogestrel (NEXPLANON) 68 MG IMPL implant 1 each by Subdermal route once.   No facility-administered encounter medications on file as of 07/03/2015.  Past Psychiatric History/Hospitalization(s): Anxiety: No Bipolar Disorder: No Depression: Yes Mania: No Psychosis: No Schizophrenia: No Personality Disorder: No Hospitalization for psychiatric illness: Yes [facility] in July for depression and SI then [facility]  History of Electroconvulsive Shock  Therapy: No Prior Suicide Attempts: No denies current access to guns.   Physical Exam: Constitutional:  BP 118/78 mmHg  Pulse 81  Ht 5\' 6"  (1.676 m)  Wt 130 lb 9.6 oz (59.24 kg)  BMI 21.09 kg/m2  General Appearance: alert, oriented, no acute distress  Musculoskeletal: Strength & Muscle Tone: within normal limits Gait & Station: normal Patient leans: N/A  Mental Status Examination/Evaluation: Objective: Attitude: Calm and cooperative  Appearance: Fairly Groomed and Neat, appears to be stated age  Eye Contact::  Good  Speech:  Clear and Coherent and Normal Rate  Volume:  Normal  Mood:  euthymic  Affect:  Full Range  Thought Process:  Goal Directed, Linear and Logical  Orientation:  Full (Time, Place, and Person)  Thought Content:  Negative  Suicidal Thoughts:  No  Homicidal Thoughts:  No  Judgement:  Good  Insight:  Good  Concentration: good  Memory: Immediate-good Recent-good Remote-good  Recall: fair  Language: fair  Gait and Station: normal  [employer] of Knowledge: average  Psychomotor Activity:  Normal  Akathisia:  No  Handed:  Right  AIMS (if indicated):  n/a   Assets:  Manufacturing systems engineerCommunication Skills Desire for Improvement Financial Resources/Insurance Housing Leisure Time Physical Health Resilience Social Support Veterinary surgeonTalents/Skills Transportation Vocational/Educational       Medical Decision Making (Choose Three): Established Problem, Stable/Improving (1), Review of Psycho-Social Stressors (1) and Review of Medication Regimen & Side Effects (2)  Assessment: Axis I: MDD- single, severe without psychotic features; GAD  Axis II: deferred     Plan:   Medication management with supportive therapy. Risks/benefits and SE of the medication discussed. Pt verbalized understanding and verbal consent obtained for treatment.  Affirm with the patient that the medications are taken as ordered. Patient expressed understanding of how their medications were to be used.   -improvement of symptoms Continue Wellbutrin XL 150 mg by mouth every morning for depression Continue BuSpar 5 mg by mouth 3 times a day for depression and anxiety   Labs: reviewed K 3.3, ALT 13, CBC WNL, TSH WNL, UDS negative; EKG NSR, QTc 391  Therapy: brief supportive therapy provided. Discussed psychosocial stressors in detail.   Validated and encouraged pt's sobriety Encouraged to continue individual therapy with Frankie   Pt denies SI and is at an acute low risk for suicide.Patient told to call clinic if any problems occur. Patient advised to go to ER if they should develop SI/HI, side effects, or if symptoms worsen. Has crisis numbers to call if needed. Pt verbalized understanding.  F/up in 2 months or sooner if needed   Oletta DarterAGARWAL, Medora Roorda, MD 07/03/2015

## 2015-07-12 ENCOUNTER — Ambulatory Visit (INDEPENDENT_AMBULATORY_CARE_PROVIDER_SITE_OTHER): Payer: BLUE CROSS/BLUE SHIELD | Admitting: Clinical

## 2015-07-12 DIAGNOSIS — F1020 Alcohol dependence, uncomplicated: Secondary | ICD-10-CM

## 2015-07-12 DIAGNOSIS — F331 Major depressive disorder, recurrent, moderate: Secondary | ICD-10-CM | POA: Diagnosis not present

## 2015-07-12 NOTE — Progress Notes (Signed)
   THERAPIST PROGRESS NOTE  Session Time: 8:06 - 8:59  Participation Level: Active  Behavioral Response: NeatAlertAnxious  Type of Therapy: Individual Therapy  Treatment Goals addressed: improve psychiatric symptoms, improve unhelpful though patterns, relapse prevention skills  Interventions: CBT and Motivational Interviewing, Grounding and Mindfulness Techniques  Summary: Nicole Castaneda is a 24 y.o. female who presents with   Suicidal/Homicidal: No -without intent/plan  Therapist Response:  Nicole Castaneda met with clinician for an individual session. Nicole Castaneda discussed her psychiatric symptoms,her current life events and her homework. Nicole Castaneda and clinician reviewed and discussed her homework. Nicole Castaneda shared that she felt like she was making some progress and does not spend much time ruminating anymore. She shared that where she over focuses is now isn't on the past but is on work. "I am a workaholic and I don't think that is healthy." Client and clinician discussed Nissa's motivation for overworking. She had the insight that it has to do with a sense of control and identity. She stated that it helps her to focus away from her other life stressors. Nicole Castaneda shared that she continues to be abstinent from alcohol. She shared that she is keeping track of when the desire to drink shows up.. She shared that usually when she notices the desire to drink there is a recent increase in stress. Client and clinician discussed the skills she is using to cope with stress rather than drink about stress.client and clinician discussed her skills and is well of additional skills. Client and clinician discussed how the skills could also be used not to relapse into depression. Nicole Castaneda shared that she has set some boundaries with her family struggles internally about her family dynamics. Nicole Castaneda shared some a bout her family dynamics and her decision (at least at this point) to not be with them on Christmas.  Nicole Castaneda shared that she preferred to do some volunteer work on Christmas Day. She shared a about some of the interactions with her mother which cause her to feel bad about herself. She shared that she has come to accept that her mother behaves like she does, but that she does not need to be there for it. Client and clinician discussed her emotions a bout setting the boundary about being separated from her family on the holidays. Nicole Castaneda agreed to continue her homework until next session   Plan: Return again in 1 -2 weeks.  Diagnosis: Axis I: Major Depressive Disorder, recurrent, moderate and alcohol use disorder, moderate, dependence   Jonet Mathies A, LCSW 07/12/2015

## 2015-07-16 ENCOUNTER — Encounter (HOSPITAL_COMMUNITY): Payer: Self-pay | Admitting: Clinical

## 2015-07-26 ENCOUNTER — Encounter (HOSPITAL_COMMUNITY): Payer: Self-pay | Admitting: Clinical

## 2015-07-26 ENCOUNTER — Ambulatory Visit (INDEPENDENT_AMBULATORY_CARE_PROVIDER_SITE_OTHER): Payer: BLUE CROSS/BLUE SHIELD | Admitting: Clinical

## 2015-07-26 DIAGNOSIS — F331 Major depressive disorder, recurrent, moderate: Secondary | ICD-10-CM | POA: Diagnosis not present

## 2015-07-26 DIAGNOSIS — F1020 Alcohol dependence, uncomplicated: Secondary | ICD-10-CM

## 2015-07-26 NOTE — Progress Notes (Signed)
   THERAPIST PROGRESS NOTE  Session Time: 8:10 - 9:05  Participation Level: Active  Behavioral Response: CasualAlertNA  Type of Therapy: Individual Therapy  Treatment Goals addressed: improve psychiatric symptoms, Elevate mood , improve unhelpful though patterns, relapse prevention skills  Interventions: CBT and Motivational Interviewing,   Summary: Nicole Castaneda is Castaneda 24 y.o. female who presents with   Suicidal/Homicidal: No -without intent/plan  Therapist Response:  Nicole Castaneda met with clinician for an individual session. Nicole Castaneda discussed her psychiatric symptoms, and her current life events and her homework. Client and clinician reviewed and discussed her homework packet. Nicole Castaneda shared her thoughts and insights from the packet.She shared some of the ways she recognizes her growth. She shared that she has been practicing her mindfulness techniques and has been seeing positive results. Nicole Castaneda shared that she went to her Grandparents for Christmas. She shared that her parents were there but that she had Castaneda nice time. She shared about her desire to keep good boundaries with her mother.  Client and clinician discussed how to maintain good boundaries while also allowing for some relationship. Nicole Castaneda shared that she went to [place] bar with her cousin to see Castaneda band. She denied drinking any alcohol. Client and clinician discussed relapse prevention skills. Clinician asked open ended questions and Nicole Castaneda identified triggers for drinking, she also identified the benefits of being abstinent form alcohol. She smiled Castaneda laughed as she shared about her life and clarity improving. She shared that alcohol would distract her from her path. Nicole Castaneda agreed to continue her homework until next session.  Plan: Return again in 1 -2 weeks.  Diagnosis: Axis I: Major Depressive Disorder, recurrent, moderate and alcohol use disorder, moderate, dependence    Nicole Tango A, LCSW 07/26/2015

## 2015-08-09 ENCOUNTER — Encounter (HOSPITAL_COMMUNITY): Payer: Self-pay | Admitting: Clinical

## 2015-08-09 ENCOUNTER — Ambulatory Visit (INDEPENDENT_AMBULATORY_CARE_PROVIDER_SITE_OTHER): Payer: BLUE CROSS/BLUE SHIELD | Admitting: Clinical

## 2015-08-09 DIAGNOSIS — F331 Major depressive disorder, recurrent, moderate: Secondary | ICD-10-CM | POA: Diagnosis not present

## 2015-08-09 DIAGNOSIS — F1020 Alcohol dependence, uncomplicated: Secondary | ICD-10-CM

## 2015-08-09 NOTE — Progress Notes (Signed)
   THERAPIST PROGRESS NOTE  Session Time: 8:03 - 8:57  Participation Level: Active  Behavioral Response: CasualAlertAnxious and Depressed  Type of Therapy: Individual Therapy  Treatment Goals addressed: improve psychiatric symptoms, Elevate mood , improve unhelpful though patterns  relapse prevention skills  Interventions: CBT and Motivational Interviewing,   Summary: Nicole Castaneda is a 25 y.o. female who presents with   Suicidal/Homicidal: No -without intent/plan  Therapist Response:  Nicole Castaneda met with clinician for an individual session. Nicole Castaneda discussed her psychiatric symptoms, and her current life events. Nicole Castaneda shared that she has continued to successful practice her relapse prevention skills. She shared that she had not had any close calls since last session. She shared that she is enjoying her ability to be more focussed. Nicole Castaneda shared that other than that it had been a difficult week for her because she had found out things on facebook about her friends rather than from them directly. She shared that she had been excluded from certain events which made her feel lonely and hurt. Client and clinician discussed the events. Her negative automatic thoughts, the evidence for and against the thoughts. [name] was then able to formulate health alternative thoughts. Nicole Castaneda shared that she is starting school again and is concerned about returning to old unhealthy behaviors with regard to the way she divides her time. Clinician asked open ended question which Nicole Castaneda answered and Nicole Castaneda formulated some steps she could take to prevent this from happening -including setting up times for breaks from work   Plan: Return again in 1 -2 weeks.  Diagnosis: Axis I: Major Depressive Disorder, recurrent, moderate and alcohol use disorder, moderate, dependence   Trystyn Dolley A, LCSW 08/09/2015

## 2015-08-21 ENCOUNTER — Ambulatory Visit (INDEPENDENT_AMBULATORY_CARE_PROVIDER_SITE_OTHER): Payer: BLUE CROSS/BLUE SHIELD | Admitting: Clinical

## 2015-08-21 ENCOUNTER — Encounter (HOSPITAL_COMMUNITY): Payer: Self-pay | Admitting: Clinical

## 2015-08-21 DIAGNOSIS — F331 Major depressive disorder, recurrent, moderate: Secondary | ICD-10-CM | POA: Diagnosis not present

## 2015-08-21 DIAGNOSIS — F1020 Alcohol dependence, uncomplicated: Secondary | ICD-10-CM | POA: Diagnosis not present

## 2015-08-21 NOTE — Progress Notes (Signed)
   THERAPIST PROGRESS NOTE  Session Time: 8:05 -9:00  Participation Level: Active  Behavioral Response: CasualAlertDepressed  Type of Therapy: Individual Therapy  Treatment Goals addressed: improve psychiatric symptoms, Elevate mood , improve unhelpful though patterns , relapse prevention skills  Interventions: CBT and Motivational Interviewing  Summary: Nicole Castaneda is a 25 y.o. female who presents with   Suicidal/Homicidal: No -without intent/plan  Therapist Response:  Nicole Castaneda met with clinician for an individual session. Nicole Castaneda discussed her psychiatric symptoms,  her current life events and her homework. Nicole Castaneda and clinician reviewed her homework packet. Nicole Castaneda shared about her own experience with the things discussed in the packet. Nicole Castaneda shared that she continued her recovery process and that her friends have been supportive of her continued sobriety. She shared however her mother has questioned her on weather or not she really has a drinking problem. Client and clinician discussed her reactions, as well as her automatic thoughts. Clinician asked open ended questions and Nicole Castaneda  Answered. Nicole Castaneda was able to recognize that it was unimportant whether her mother validated her struggle with alcohol, what was important was that Nicole Castaneda makes choices that are right for her and the life she is building. Nicole Castaneda shared that her grandparents did not serve alcohol over the holiday when she was there (though no mention was made about it), and they usually do. Clinician suggested that this was a sign of respect and support from her grandparents. Nicole Castaneda teared up and shared she had not considered that. Nicole Castaneda shared about her return to school and efforts to be more balanced with her time. Nicole Castaneda also shared that she had been using her cbt skills to challenge her thoughts about friendship. She shared that when she did so she was able to approach someone she she felt distanced  from. In doing so she was able to change her perception, which opened her to having a healthy conversation with the other person. Client and clinician discussed her process and lessons learned. Nicole Castaneda shared that this helped her to feel more empowered and happy. Nicole Castaneda shared some about her relationship with her mother. Client and clinician discussed how she could use her tools to help her accept her mother as she is, and also set healthy boundaries. Nicole Castaneda agreed to continue her homework until next session.   Plan: Return again in 1 -2 weeks.  Diagnosis: Axis I: Major Depressive Disorder, recurrent, moderate and alcohol use disorder, moderate, dependence   Nicole Castaneda A, LCSW 08/21/2015

## 2015-08-27 ENCOUNTER — Other Ambulatory Visit (HOSPITAL_COMMUNITY): Payer: Self-pay | Admitting: Psychiatry

## 2015-09-04 ENCOUNTER — Ambulatory Visit (INDEPENDENT_AMBULATORY_CARE_PROVIDER_SITE_OTHER): Payer: BLUE CROSS/BLUE SHIELD | Admitting: Psychiatry

## 2015-09-04 ENCOUNTER — Encounter (HOSPITAL_COMMUNITY): Payer: Self-pay | Admitting: Psychiatry

## 2015-09-04 VITALS — BP 106/76 | HR 90 | Ht 66.0 in | Wt 128.4 lb

## 2015-09-04 DIAGNOSIS — F322 Major depressive disorder, single episode, severe without psychotic features: Secondary | ICD-10-CM | POA: Diagnosis not present

## 2015-09-04 DIAGNOSIS — F411 Generalized anxiety disorder: Secondary | ICD-10-CM | POA: Diagnosis not present

## 2015-09-04 MED ORDER — BUPROPION HCL ER (XL) 150 MG PO TB24: 150.0000 mg | ORAL_TABLET | Freq: Every day | ORAL | Status: DC

## 2015-09-04 MED ORDER — BUSPIRONE HCL 5 MG PO TABS: 5.0000 mg | ORAL_TABLET | Freq: Three times a day (TID) | ORAL | Status: DC

## 2015-09-04 NOTE — Progress Notes (Signed)
Patient ID: [name], female   DOB: 07-24-1991, 25 y.o.   MRN: [id number] Patient ID: [name], female   DOB: April 20, 1991, 25 y.o.   MRN: [id number]  [facility] [id number] Progress Note  Nicole Castaneda [id number] 25 y.o.  09/04/2015 9:02 AM  Chief Complaint: "its been a rough few weeks"  History of Present Illness: States she is doing well overall but it has been a rough few weeks. Pt has not had any alcohol since August. Pt is not going to [employer] and last went in Sept. Denies any cravings or desire to drink alcohol.  Pt is in classes at [employer] since Jan. She finds it hard but likes it. Reports she has had some social drama with her classmates.  Depression is present but significantly improved. Pt has sad mood once a week for about 1-2 hrs. She treats it by getting active and distracting herself. States she is really busy so doesn't have time to be depressed. Denies isolation, crying spells, anehdonia, worthlessness and hopelessness.   Sleeping about 8-9 hrs/night. Pt is not taking Vistaril and Trazodone.  Energy is on the low side due to being so busy. Appetite and concentration are good.   Anxiety is resolved.  Taking Wellbutrin and Buspar as prescribed and denies SE.   Suicidal Ideation: No Plan Formed: No Patient has means to carry out plan: No  Homicidal Ideation: No Plan Formed: No Patient has means to carry out plan: No  Review of Systems: Psychiatric: Agitation: No Hallucination: No Depressed Mood: Yes Insomnia: No Hypersomnia: No Altered Concentration: No Feels Worthless: No Grandiose Ideas: No Belief In Special Powers: No New/Increased Substance Abuse: No Compulsions: No  Neurologic: Headache: No Seizure: No Paresthesias: No  Review of Systems  Constitutional: Negative for fever, chills and weight loss.  HENT: Negative for congestion, ear pain, nosebleeds and sore throat.   Eyes: Negative for blurred vision, double vision, pain and  redness.  Respiratory: Negative for cough, sputum production and wheezing.   Cardiovascular: Negative for chest pain, palpitations and leg swelling.  Gastrointestinal: Negative for heartburn, nausea, vomiting and abdominal pain.  Musculoskeletal: Negative for back pain, joint pain and neck pain.  Skin: Negative for itching and rash.  Neurological: Negative for dizziness, tremors, seizures, loss of consciousness, weakness and headaches.  Psychiatric/Behavioral: Negative for depression, suicidal ideas, hallucinations and substance abuse. The patient is not nervous/anxious and does not have insomnia.      Past Medical Family, Social History: living in apartment alone. Pt is taking one class at [employer]. 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 Single with no kids. Parents are in [place] and she doesn't visit them. Only child.  reports that she has never smoked. She has never used smokeless tobacco. She reports that she does not drink alcohol or use illicit drugs.  Family History  Problem Relation Age of Onset  . Alcohol abuse Mother   . Alcohol abuse Father   . Diabetes Maternal Grandmother     Past Medical History  Diagnosis Date  . Scoliosis   . Depression   . Alcohol abuse     Outpatient Encounter Prescriptions as of 09/04/2015  Medication Sig  . adapalene (DIFFERIN) 0.1 % gel Apply topically at bedtime.  Marland Kitchen buPROPion (WELLBUTRIN XL) 150 MG 24 hr tablet Take 1 tablet (150 mg total) by mouth daily.  . busPIRone (BUSPAR) 5 MG tablet Take 1 tablet (5 mg total) by  mouth 3 (three) times daily.  Marland Kitchen EPINEPHRINE IJ Inject as directed.  . etonogestrel (NEXPLANON) 68 MG IMPL implant 1 each by Subdermal route once.  . hpv vaccine (GARDASIL) injection Inject 0.5 mLs into the muscle once.  . minocycline (MINOCIN,DYNACIN) 50 MG capsule Take 50 mg by mouth 2 (two) times daily.  . clindamycin (CLEOCIN) 75 MG/5ML solution Take by mouth  once. Reported on 09/04/2015   No facility-administered encounter medications on file as of 09/04/2015.    Past Psychiatric History/Hospitalization(s): Anxiety: No Bipolar Disorder: No Depression: Yes Mania: No Psychosis: No Schizophrenia: No Personality Disorder: No Hospitalization for psychiatric illness: Yes [facility] in July for depression and SI then did PHP at [facility].  History of Electroconvulsive Shock Therapy: No Prior Suicide Attempts: No denies current access to guns.   Physical Exam: Constitutional:  BP 106/76 mmHg  Pulse 90  Ht  (1.676 m)  Wt 128 lb 6.4 oz (58.242 kg)  BMI 20.73 kg/m2  General Appearance: alert, oriented, no acute distress  Musculoskeletal: Strength & Muscle Tone: within normal limits Gait & Station: normal Patient leans: N/A  Mental Status Examination/Evaluation: Objective: Attitude: Calm and cooperative  Appearance: Fairly Groomed and Neat, appears to be stated age  Eye Contact::  Good  Speech:  Clear and Coherent and Normal Rate  Volume:  Normal  Mood:  euthymic  Affect:  Full Range  Thought Process:  Goal Directed, Linear and Logical  Orientation:  Full (Time, Place, and Person)  Thought Content:  Negative  Suicidal Thoughts:  No  Homicidal Thoughts:  No  Judgement:  Good  Insight:  Good  Concentration: good  Memory: Immediate-good Recent-good Remote-good  Recall: fair  Language: fair  Gait and Station: normal  [employer] of Knowledge: average  Psychomotor Activity:  Normal  Akathisia:  No  Handed:  Right  AIMS (if indicated):  n/a   Assets:  Manufacturing systems engineer Desire for Improvement Financial Resources/Insurance Housing Leisure Time Physical Health Resilience Social Support Veterinary surgeon Decision Making (Choose Three): Established Problem, Stable/Improving (1), Review of Psycho-Social Stressors (1) and Review of Medication Regimen & Side Effects  (2)  Assessment: Axis I: MDD- single, severe without psychotic features; GAD  Axis II: deferred     Plan:   Medication management with supportive therapy. Risks/benefits and SE of the medication discussed. Pt verbalized understanding and verbal consent obtained for treatment.  Affirm with the patient that the medications are taken as ordered. Patient expressed understanding of how their medications were to be used.   Continue Wellbutrin XL 150 mg by mouth every morning for depression Continue BuSpar 5 mg by mouth 3 times a day for depression and anxiety   Labs: reviewed K 3.3, ALT 13, CBC WNL, TSH WNL, UDS negative; EKG NSR, QTc 391  Therapy: brief supportive therapy provided. Discussed psychosocial stressors in detail.   Validated and encouraged pt's sobriety Encouraged to continue individual therapy with Frankie   Pt denies SI and is at an acute low risk for suicide.Patient told to call clinic if any problems occur. Patient advised to go to ER if they should develop SI/HI, side effects, or if symptoms worsen. Has crisis numbers to call if needed. Pt verbalized understanding.  F/up in 2 months or sooner if needed   Oletta Darter, MD 09/04/2015

## 2015-09-13 ENCOUNTER — Encounter (HOSPITAL_COMMUNITY): Payer: Self-pay | Admitting: Clinical

## 2015-09-13 ENCOUNTER — Ambulatory Visit (INDEPENDENT_AMBULATORY_CARE_PROVIDER_SITE_OTHER): Payer: BLUE CROSS/BLUE SHIELD | Admitting: Clinical

## 2015-09-13 DIAGNOSIS — F1020 Alcohol dependence, uncomplicated: Secondary | ICD-10-CM

## 2015-09-13 DIAGNOSIS — F331 Major depressive disorder, recurrent, moderate: Secondary | ICD-10-CM

## 2015-09-13 NOTE — Progress Notes (Signed)
   THERAPIST PROGRESS NOTE  Session Time: 8:04 - 9:00  Participation Level: Active  Behavioral Response: CasualAlertsad  Type of Therapy: Individual Therapy  Treatment Goals addressed: improve psychiatric symptoms, Elevate mood (increased confidence), improve unhelpful though patterns , relapse prevention skills  Interventions: CBT and Motivational Interviewing,   Summary: Velmer Broadfoot is a 25 y.o. female who presents with   Suicidal/Homicidal: No -without intent/plan  Therapist Response:  [name] met with clinician for an individual session. [name] discussed her psychiatric symptoms, her current life events and her homework. [name] shared that she had not completed her homework packet. She shared that she was focussed on school and trying to make her schedule more balanced. She shared that she believes she is doing a little better than last year because she is doing a good job but keeping her tendency towards perfectionism in better balance. She shared that she is making an effort to plan better. She shared she continues to use her grounding and mindfulness techniques and finds them helpful in decreasing stress and to refocus herself. She stated she would complete the packet before next session. She shared that she had not relapsed and continues to enjoy the benefit of sobriety. Client and clinician discussed and reviewed her relapse prevention skills. [name] shared some about her relationship with her family. She shared that her thoughts and beliefs about them and their relationship show up frequently and increases her depression and since of hopelessness. Client and clinician discussed her negative automatic thoughts and beliefs. Client and clinician discussed her emotions as they related to the thoughts. Client and clinician discussed the evidence for and against the thoughts. Client and clinician discussed healthier alternative thoughts and perspectives. [name] had the insight  that it would be easier to change her thoughts than it would be to wait for the other people to change. [name] shared that if she believed the healthier thoughts she would feel empowered rather than hopeless. Client and clinician discussed actions [name] could take to strengthen the healthier alternative thoughts.   Plan: Return again in 1 -2 weeks.  Diagnosis: Axis I: Major Depressive Disorder, recurrent, moderate and alcohol use disorder, moderate, dependence   Henery Betzold A, LCSW 09/13/2015

## 2015-09-27 ENCOUNTER — Encounter (HOSPITAL_COMMUNITY): Payer: Self-pay | Admitting: Clinical

## 2015-09-27 ENCOUNTER — Ambulatory Visit (INDEPENDENT_AMBULATORY_CARE_PROVIDER_SITE_OTHER): Payer: BLUE CROSS/BLUE SHIELD | Admitting: Clinical

## 2015-09-27 DIAGNOSIS — F1020 Alcohol dependence, uncomplicated: Secondary | ICD-10-CM | POA: Diagnosis not present

## 2015-09-27 DIAGNOSIS — F331 Major depressive disorder, recurrent, moderate: Secondary | ICD-10-CM

## 2015-09-27 NOTE — Progress Notes (Signed)
   THERAPIST PROGRESS NOTE  Session Time: 8:05 -9:02  Participation Level: Active  Behavioral Response: CasualAlertNA  Type of Therapy: Individual Therapy  Treatment Goals addressed: improve psychiatric symptoms, Elevate mood (increased confidence and willing to try new things), improve unhelpful though patterns, relapse prevention skills  Interventions: CBT and Motivational Interviewing,   Summary: Nicole Castaneda is a 25 y.o. female who presents with   Suicidal/Homicidal: No -without intent/plan  Therapist Response:  Nicole Castaneda met with clinician for an individual session. Nicole Castaneda discussed her psychiatric symptoms, her current life events and her homework. Nicole Castaneda and clinician reviewed and discussed her homework packet. Nicole Castaneda shared her thoughts and insights from the packet. Client and clinician discussed how the information presented in the packet related to her psychiatric symptoms and the skills that she is practicing. Client and clinician discussed her changing thoughts and how they are affecting her psychiatric symptoms and experiences. Nicole Castaneda shared that she had made the decision to a tanned a AA meeting. Clinician suggests that Nicole Castaneda attend a women's AA meeting. Client and clinician discussed Nicole Castaneda's decision. Nicole Castaneda shared that she was insecure about going in the past because she felt like it was it excepting the fact that she has a problem with alcohol. Nicole Castaneda has remained sober. Client and clinician discussed her thoughts about her and alcohol now. Nicole Castaneda shared that the more distant she gets from drinking the more she recognizes that it really is a problem. Client and clinician discussed the strength in acceptance. Client and clinician discussed relapse prevention skills. Nicole Castaneda shared that she continues to use her grounding and mindfulness techniques and continues to finding them helpful. Nicole Castaneda shared that she had been test coming up for midterms. She  shared that this time last year she would've been frantic. She said that she is feeling more confident and less stressed this year. Client and clinician discussed her thoughts and insights a bout what this was true. Nicole Castaneda shared that she had been using her CBT skills and has worked to release unhelpful perfectionist beliefs. Nicole Castaneda shared that since last session she has recognized opportunities for healthy relationships with adults. Client and clinician discussed how having relationships with people who are interested in topic she Is interested in would be beneficial. Nicole Castaneda stated that this would qualify as a willingness try new things. Clinician agreed.     Plan: Return again in 1 -2 weeks.  Diagnosis: Axis I: Major Depressive Disorder, recurrent, moderate and alcohol use disorder, moderate, dependence    Teriah Muela A, LCSW 09/27/2015

## 2015-10-11 ENCOUNTER — Ambulatory Visit (HOSPITAL_COMMUNITY): Payer: Self-pay | Admitting: Clinical

## 2015-10-25 ENCOUNTER — Ambulatory Visit (HOSPITAL_COMMUNITY): Payer: Self-pay | Admitting: Clinical

## 2015-11-06 ENCOUNTER — Ambulatory Visit (INDEPENDENT_AMBULATORY_CARE_PROVIDER_SITE_OTHER): Payer: BLUE CROSS/BLUE SHIELD | Admitting: Psychiatry

## 2015-11-06 ENCOUNTER — Encounter (HOSPITAL_COMMUNITY): Payer: Self-pay | Admitting: Psychiatry

## 2015-11-06 VITALS — BP 112/71 | HR 76 | Ht 66.0 in | Wt 127.8 lb

## 2015-11-06 DIAGNOSIS — F411 Generalized anxiety disorder: Secondary | ICD-10-CM

## 2015-11-06 DIAGNOSIS — F322 Major depressive disorder, single episode, severe without psychotic features: Secondary | ICD-10-CM

## 2015-11-06 MED ORDER — BUPROPION HCL ER (XL) 150 MG PO TB24: 150.0000 mg | ORAL_TABLET | Freq: Every day | ORAL | Status: DC

## 2015-11-06 MED ORDER — BUSPIRONE HCL 5 MG PO TABS: 5.0000 mg | ORAL_TABLET | Freq: Three times a day (TID) | ORAL | Status: DC

## 2015-11-06 NOTE — Progress Notes (Signed)
[facility] [id number] Progress Note  Nicole SiadVictoria Castaneda [id number] 25 y.o.  11/06/2015 10:11 AM  Chief Complaint: "good"  History of Present Illness: States she is doing well overall. Pt has not had any alcohol since August. Pt is going to [employer] meetings about once a week.. Denies any cravings or desire to drink alcohol.  Pt is in classes at [employer] since Jan. She finds it hard but likes it. Reports she has had some social drama with her classmates.Pt is staying out of it.   Depression is present and she doesn't think about it much. Pt has sad mood once a week for about 1-2 hrs. She treats it by getting active and distracting herself. States she is really busy so doesn't have time to be depressed. Denies isolation, crying spells, anehdonia, worthlessness and hopelessness.   Sleeping about 8-9 hrs/night. Energy is on the low side due to being so busy. Appetite and concentration are good.   Anxiety is resolved.  Taking Wellbutrin and Buspar as prescribed and denies SE.   Suicidal Ideation: No Plan Formed: No Patient has means to carry out plan: No  Homicidal Ideation: No Plan Formed: No Patient has means to carry out plan: No  Review of Systems: Psychiatric: Agitation: No Hallucination: No Depressed Mood: No Insomnia: No Hypersomnia: No Altered Concentration: No Feels Worthless: No Grandiose Ideas: No Belief In Special Powers: No New/Increased Substance Abuse: No Compulsions: No  Neurologic: Headache: No Seizure: No Paresthesias: No  Review of Systems  Constitutional: Negative for fever, chills and weight loss.  HENT: Negative for congestion, ear pain, nosebleeds and sore throat.   Eyes: Negative for blurred vision, double vision, pain and redness.  Respiratory: Negative for cough, sputum production and wheezing.   Cardiovascular: Negative for chest pain, palpitations and leg swelling.  Gastrointestinal: Negative for heartburn, nausea, vomiting and abdominal pain.   Musculoskeletal: Negative for back pain, joint pain and neck pain.  Skin: Negative for itching and rash.  Neurological: Negative for dizziness, tremors, seizures, loss of consciousness, weakness and headaches.  Psychiatric/Behavioral: Negative for depression, suicidal ideas, hallucinations and substance abuse. The patient is not nervous/anxious and does not have insomnia.      Past Medical Family, Social History: living in apartment alone. Pt is taking one class at [employer]. 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 Single with no kids. Parents are in [place] and she doesn't visit them. Only child.  reports that she has never smoked. She has never used smokeless tobacco. She reports that she does not drink alcohol or use illicit drugs.  Family History  Problem Relation Age of Onset  . Alcohol abuse Mother   . Alcohol abuse Father   . Diabetes Maternal Grandmother     Past Medical History  Diagnosis Date  . Scoliosis   . Depression   . Alcohol abuse     Outpatient Encounter Prescriptions as of 11/06/2015  Medication Sig  . buPROPion (WELLBUTRIN XL) 150 MG 24 hr tablet Take 1 tablet (150 mg total) by mouth daily.  . busPIRone (BUSPAR) 5 MG tablet Take 1 tablet (5 mg total) by mouth 3 (three) times daily.  Marland Kitchen. EPINEPHRINE IJ Inject as directed.  . etonogestrel (NEXPLANON) 68 MG IMPL implant 1 each by Subdermal route once.  . hpv vaccine (GARDASIL) injection Inject 0.5 mLs into the muscle once.  . minocycline (MINOCIN,DYNACIN) 50 MG capsule Take 50 mg by mouth 2 (two) times daily.  .Marland Kitchen  adapalene (DIFFERIN) 0.1 % gel Apply topically at bedtime. Reported on 11/06/2015  . clindamycin (CLEOCIN) 75 MG/5ML solution Take by mouth once. Reported on 11/06/2015   No facility-administered encounter medications on file as of 11/06/2015.    Past Psychiatric History/Hospitalization(s): Anxiety: No Bipolar Disorder: No Depression:  Yes Mania: No Psychosis: No Schizophrenia: No Personality Disorder: No Hospitalization for psychiatric illness: Yes [facility] in July for depression and SI then [facility] PHP [facility].  History of Electroconvulsive Shock Therapy: No Prior Suicide Attempts: No denies current access to guns.   Physical Exam: Constitutional:  BP 112/71 mmHg  Pulse 76  Ht  (1.676 m)  Wt 127 lb 12.8 oz (57.97 kg)  BMI 20.64 kg/m2  General Appearance: alert, oriented, no acute distress  Musculoskeletal: Strength & Muscle Tone: within normal limits Gait & Station: normal Patient leans: straight  Mental Status Examination/Evaluation: Objective: Attitude: Calm and cooperative  Appearance: Fairly Groomed and Neat, appears to be stated age  Eye Contact::  Good  Speech:  Clear and Coherent and Normal Rate  Volume:  Normal  Mood:  euthymic  Affect:  Full Range  Thought Process:  Goal Directed, Linear and Logical  Orientation:  Full (Time, Place, and Person)  Thought Content:  Negative  Suicidal Thoughts:  No  Homicidal Thoughts:  No  Judgement:  Good  Insight:  Good  Concentration: good  Memory: Immediate-good Recent-good Remote-good  Recall: fair  Language: fair  Gait and Station: normal  [employer] of Knowledge: average  Psychomotor Activity:  Normal  Akathisia:  No  Handed:  Right  AIMS (if indicated):  n/a   Assets:  Manufacturing systems engineer Desire for Improvement Financial Resources/Insurance Housing Leisure Time Physical Health Resilience Social Support Veterinary surgeon Decision Making (Choose Three): Established Problem, Stable/Improving (1), Review of Psycho-Social Stressors (1) and Review of Medication Regimen & Side Effects (2)  Assessment: Axis I: MDD- single, severe without psychotic features; GAD  Axis II: deferred     Plan:   Medication management with supportive therapy. Risks/benefits and SE of the medication  discussed. Pt verbalized understanding and verbal consent obtained for treatment.  Affirm with the patient that the medications are taken as ordered. Patient expressed understanding of how their medications were to be used.   Continue Wellbutrin XL 150 mg by mouth every morning for depression Continue BuSpar 5 mg by mouth 3 times a day for depression and anxiety   Labs: reviewed K 3.3, ALT 13, CBC WNL, TSH WNL, UDS negative; EKG NSR, QTc 391  Therapy: brief supportive therapy provided. Discussed psychosocial stressors in detail.   Validated and encouraged pt's sobriety Encouraged to continue individual therapy with Frankie   Pt denies SI and is at an acute low risk for suicide.Patient told to call clinic if any problems occur. Patient advised to go to ER if they should develop SI/HI, side effects, or if symptoms worsen. Has crisis numbers to call if needed. Pt verbalized understanding.  F/up in 3 months or sooner if needed   Oletta Darter, MD 11/06/2015

## 2015-11-20 ENCOUNTER — Ambulatory Visit (INDEPENDENT_AMBULATORY_CARE_PROVIDER_SITE_OTHER): Payer: BLUE CROSS/BLUE SHIELD | Admitting: Clinical

## 2015-11-20 ENCOUNTER — Encounter (HOSPITAL_COMMUNITY): Payer: Self-pay | Admitting: Clinical

## 2015-11-20 DIAGNOSIS — F1020 Alcohol dependence, uncomplicated: Secondary | ICD-10-CM

## 2015-11-20 DIAGNOSIS — F331 Major depressive disorder, recurrent, moderate: Secondary | ICD-10-CM | POA: Diagnosis not present

## 2015-11-20 NOTE — Progress Notes (Signed)
   THERAPIST PROGRESS NOTE  Session Time: 8:10 - 9:05   Participation Level: Active  Behavioral Response: CasualAlertAnxious  Type of Therapy: Individual Therapy  Treatment Goals addressed: improve psychiatric symptoms, Elevate mood (increased confidence and willing to try new things), improve unhelpful though patterns (reduction in time spent ruminating), relapse prevention skills  Interventions: CBT and Motivational Interviewing, Grounding and Mindfulness Techniques  Summary: Nicole Castaneda is a 25 y.o. female who presents with   Suicidal/Homicidal: No -without intent/plan  Therapist Response:  Nicole Castaneda met with clinician for an individual session. Nicole Castaneda discussed her psychiatric symptoms, and her current life events. Nicole Castaneda shared that she has been experiencing some depression but has not relapsed. Nicole Castaneda and clinician reviewed and disscussed her homework packet. She shared that it helped her recognize how unbalanced she was currently (expected at the end of the school year) and how unbalanced her plans for the summer are. Client and clinician discussed what would be the likely outcome if she were to continue this way. Nicole Castaneda identified likely down falls and changes in thoughts and emotions. Clinician asked open ended questions and Nicole Castaneda identified how she could negotiate a more balanced summer schedule and how she would benefit from doing so. Client and clinician discussed her relapse prevention skills. Nicole Castaneda shared that she has been attending AA (this is new)  but is nervous to pick a sponsor. Client and clinician discussed her thoughts and emotions about getting a sponsor. Client and clinician discussed how to challenge negative automatic thoughts. Client and clinician also discussed setting healthy boundaries.  Nicole Castaneda agreed to complete a homework packet before next session.  Plan: Return again in 1 -2 weeks.  Diagnosis: Axis I: Major Depressive Disorder, recurrent,  moderate and alcohol use disorder, moderate, dependence  [name] A, LCSW 11/20/2015

## 2015-12-03 ENCOUNTER — Ambulatory Visit (INDEPENDENT_AMBULATORY_CARE_PROVIDER_SITE_OTHER): Payer: BLUE CROSS/BLUE SHIELD | Admitting: Clinical

## 2015-12-03 ENCOUNTER — Encounter (HOSPITAL_COMMUNITY): Payer: Self-pay | Admitting: Clinical

## 2015-12-03 DIAGNOSIS — F1020 Alcohol dependence, uncomplicated: Secondary | ICD-10-CM | POA: Diagnosis not present

## 2015-12-03 DIAGNOSIS — F331 Major depressive disorder, recurrent, moderate: Secondary | ICD-10-CM

## 2015-12-03 NOTE — Progress Notes (Signed)
   THERAPIST PROGRESS NOTE  Session Time: 8:07 - 9:05  Participation Level: Active  Behavioral Response: CasualAlertNA  Type of Therapy: Individual Therapy  Treatment Goals addressed: improve psychiatric symptoms, Elevate mood , improve unhelpful though patterns (reduction in time spent ruminating), r  Interventions: CBT and Motivational Interviewing, psychoeducation  Summary: Nicole Castaneda is a 25 y.o. female who presents with   Suicidal/Homicidal: No -without intent/plan  Therapist Response:  Nicole Castaneda met with clinician for an individual session. Nicole Castaneda discussed her psychiatric symptoms, and her current life events. Nicole Castaneda shared that she continues to be doing well with her psychiatric symptoms and sobriety. She shared that she has been attending AA and is planning to get a sponsor after visiting some other [place] meetings. She shared that it is finals week and its been rough but she has been practicing healthy coping skills and grounding and mindfulness techniques. She shared that she got a little over focused on her concerns about the health care bill. Client and clinician discussed what is in her power to change and what is not. She shared that she could campaign and write letters but being overly upset actually got in her way of doing something positive. Nicole Castaneda  Shared her thoughts and insights from her homework packet. Client and clinician reviewed her her progress in therapy.   Client and clinician agreed that she was ready to graduate from therapy. Client and clinician discussed steps she could take should she require therapy again.   Plan: Return again if needed.  Diagnosis: Axis I: Major Depressive Disorder, recurrent, moderate and alcohol use disorder, moderate, dependence    Julanne Schlueter A, LCSW 12/03/2015

## 2015-12-18 ENCOUNTER — Ambulatory Visit (HOSPITAL_COMMUNITY): Payer: Self-pay | Admitting: Clinical

## 2016-01-01 ENCOUNTER — Ambulatory Visit (HOSPITAL_COMMUNITY): Payer: Self-pay | Admitting: Clinical

## 2016-01-31 ENCOUNTER — Other Ambulatory Visit (HOSPITAL_COMMUNITY): Payer: Self-pay | Admitting: Psychiatry

## 2016-02-05 ENCOUNTER — Ambulatory Visit (HOSPITAL_COMMUNITY): Payer: Self-pay | Admitting: Psychiatry

## 2016-02-06 ENCOUNTER — Ambulatory Visit (HOSPITAL_COMMUNITY)
Admission: EM | Admit: 2016-02-06 | Discharge: 2016-02-06 | Disposition: A | Discharge status: Home / Self Care | Payer: BLUE CROSS/BLUE SHIELD | Attending: Family Medicine | Admitting: Family Medicine

## 2016-02-06 ENCOUNTER — Ambulatory Visit (INDEPENDENT_AMBULATORY_CARE_PROVIDER_SITE_OTHER): Payer: BLUE CROSS/BLUE SHIELD

## 2016-02-06 ENCOUNTER — Encounter (HOSPITAL_COMMUNITY): Payer: Self-pay | Admitting: *Deleted

## 2016-02-06 DIAGNOSIS — M79672 Pain in left foot: Secondary | ICD-10-CM

## 2016-02-06 NOTE — ED Notes (Signed)
Pt  Reports  l  Foot  Pain     In  Which   She   Woke    Up     The  Symptoms       About  3  Days  Ago  -  She  denys  Any  Injury       She  Has  Been  Ambulating  On  The  Foot    she  Has  No  Obvious  Deformity

## 2016-02-06 NOTE — ED Provider Notes (Signed)
CSN: [id number]     Arrival date & time 02/06/16  1636 History   First MD Initiated Contact with Patient 02/06/16 1721     Chief Complaint  Patient presents with  . Foot Pain   (Consider location/radiation/quality/duration/timing/severity/associated sxs/prior Treatment) HPI History obtained from patient: Location: Left foot  Context/Duration: Awoke with pain in left foot, no known injury  Severity: 2  Quality:ache, sharp shooting Timing:           Episodic-constant Home Treatment: alieve Associated symptoms:  none Family History: Alcohol abuse    Past Medical History  Diagnosis Date  . Scoliosis   . Depression   . Alcohol abuse    Past Surgical History  Procedure Laterality Date  . Ganglion cyst removed from left arm    . Mole removal Left   . Wisdom tooth extraction    . Top piece of ring finger detached     Family History  Problem Relation Age of Onset  . Alcohol abuse Mother   . Alcohol abuse Father   . Diabetes Maternal Grandmother    Social History  Substance Use Topics  . Smoking status: Never Smoker   . Smokeless tobacco: Never Used  . Alcohol Use: No     Comment: rare- 1 glass every couple of months since July 2016   OB History    No data available     Review of Systems  Denies: HEADACHE, NAUSEA, ABDOMINAL PAIN, CHEST PAIN, CONGESTION, DYSURIA, SHORTNESS OF BREATH  Allergies  Bee venom and Amoxicillin  Home Medications   Prior to Admission medications   Medication Sig Start Date End Date Taking? Authorizing Provider  adapalene (DIFFERIN) 0.1 % gel Apply topically at bedtime. Reported on 11/06/2015    Historical Provider, MD  buPROPion (WELLBUTRIN XL) 150 MG 24 hr tablet TAKE 1 TABLET(150 MG) BY MOUTH DAILY 02/05/16   Oletta DarterSalina Agarwal, MD  busPIRone (BUSPAR) 5 MG tablet Take 1 tablet (5 mg total) by mouth 3 (three) times daily. 11/06/15   Oletta DarterSalina Agarwal, MD  clindamycin (CLEOCIN) 75 MG/5ML solution Take by mouth once. Reported on 11/06/2015     Historical Provider, MD  EPINEPHRINE IJ Inject as directed.    Historical Provider, MD  etonogestrel (NEXPLANON) 68 MG IMPL implant 1 each by Subdermal route once.    Historical Provider, MD  hpv vaccine (GARDASIL) injection Inject 0.5 mLs into the muscle once.    Historical Provider, MD  minocycline (MINOCIN,DYNACIN) 50 MG capsule Take 50 mg by mouth 2 (two) times daily.    Historical Provider, MD   Meds Ordered and Administered this Visit  Medications - No data to display  BP 124/77 mmHg  Pulse 74  Temp(Src) 98.9 F (37.2 C) (Oral)  Resp 16  SpO2 100%  LMP 01/25/2016 No data found.   Physical Exam NURSES NOTES AND VITAL SIGNS REVIEWED. CONSTITUTIONAL: Well developed, well nourished, no acute distress HEENT: normocephalic, atraumatic EYES: Conjunctiva normal NECK:normal ROM, supple, no adenopathy PULMONARY:No respiratory distress, normal effort ABDOMINAL: Soft, ND, NT BS+, No CVAT MUSCULOSKELETAL: Normal ROM of all extremities, Left foot without swelling or visible deformity. Tender over the first MT. No lateral foot tenderness, gait is normal SKIN: warm and dry without rash PSYCHIATRIC: Mood and affect, behavior are normal  ED Course  Procedures (including critical care time)  Labs Review Labs Reviewed - No data to display  Imaging Review Dg Foot Complete Left  02/06/2016  CLINICAL DATA:  25 year old female with left foot pain for 4 days. No  known injury. EXAM: LEFT FOOT - COMPLETE 3+ VIEW COMPARISON:  None. FINDINGS: There is no evidence of fracture or dislocation. There is no evidence of arthropathy or other focal bone abnormality. Soft tissues are unremarkable. IMPRESSION: Negative. Electronically Signed   By: Harmon Pier M.D.   On: 02/06/2016 18:26     Visual Acuity Review  Right Eye Distance:   Left Eye Distance:   Bilateral Distance:    Right Eye Near:   Left Eye Near:    Bilateral Near:      I have discussed x-ray with patient and an Ace wrap has been  applied to the left foot. Suggest symptomatic treatment at home.   MDM   1. Foot pain, left     Patient is reassured that there are no issues that require transfer to higher level of care at this time or additional tests. Patient is advised to continue home symptomatic treatment. Patient is advised that if there are new or worsening symptoms to attend the emergency department, contact primary care provider, or return to UC. Instructions of care provided discharged home in stable condition.    THIS NOTE WAS GENERATED USING A VOICE RECOGNITION SOFTWARE PROGRAM. ALL REASONABLE EFFORTS  WERE MADE TO PROOFREAD THIS DOCUMENT FOR ACCURACY.  I have verbally reviewed the discharge instructions with the patient. A printed AVS was given to the patient.  All questions were answered prior to discharge.      Tharon Aquas, PA 02/06/16 1913

## 2016-02-06 NOTE — Discharge Instructions (Signed)
Heat Therapy Heat therapy can help ease sore, stiff, injured, and tight muscles and joints. Heat relaxes your muscles, which may help ease your pain. Heat therapy should only be used on old, pre-existing, or long-lasting (chronic) injuries. Do not use heat therapy unless told by your doctor. HOW TO USE HEAT THERAPY There are several different kinds of heat therapy, including:  Moist heat pack.  Warm water bath.  Hot water bottle.  Electric heating pad.  Heated gel pack.  Heated wrap.  Electric heating pad. GENERAL HEAT THERAPY RECOMMENDATIONS   Do not sleep while using heat therapy. Only use heat therapy while you are awake.  Your skin may turn pink while using heat therapy. Do not use heat therapy if your skin turns red.  Do not use heat therapy if you have new pain.  High heat or long exposure to heat can cause burns. Be careful when using heat therapy to avoid burning your skin.  Do not use heat therapy on areas of your skin that are already irritated, such as with a rash or sunburn. GET HELP IF:   You have blisters, redness, swelling (puffiness), or numbness.  You have new pain.  Your pain is worse. MAKE SURE YOU:  Understand these instructions.  Will watch your condition.  Will get help right away if you are not doing well or get worse.   This information is not intended to replace advice given to you by your health care provider. Make sure you discuss any questions you have with your health care provider.   Document Released: 10/06/2011 Document Revised: 08/04/2014 Document Reviewed: 09/06/2013 Elsevier Interactive Patient Education 2016 Elsevier Inc.  Musculoskeletal Pain Musculoskeletal pain is muscle and boney aches and pains. These pains can occur in any part of the body. Your caregiver may treat you without knowing the cause of the pain. They may treat you if blood or urine tests, X-rays, and other tests were normal.  CAUSES There is often not a  definite cause or reason for these pains. These pains may be caused by a type of germ (virus). The discomfort may also come from overuse. Overuse includes working out too hard when your body is not fit. Boney aches also come from weather changes. Bone is sensitive to atmospheric pressure changes. HOME CARE INSTRUCTIONS   Ask when your test results will be ready. Make sure you get your test results.  Only take over-the-counter or prescription medicines for pain, discomfort, or fever as directed by your caregiver. If you were given medications for your condition, do not drive, operate machinery or power tools, or sign legal documents for 24 hours. Do not drink alcohol. Do not take sleeping pills or other medications that may interfere with treatment.  Continue all activities unless the activities cause more pain. When the pain lessens, slowly resume normal activities. Gradually increase the intensity and duration of the activities or exercise.  During periods of severe pain, bed rest may be helpful. Lay or sit in any position that is comfortable.  Putting ice on the injured area.  Put ice in a bag.  Place a towel between your skin and the bag.  Leave the ice on for 15 to 20 minutes, 3 to 4 times a day.  Follow up with your caregiver for continued problems and no reason can be found for the pain. If the pain becomes worse or does not go away, it may be necessary to repeat tests or do additional testing. Your caregiver may need  to look further for a possible cause. SEEK IMMEDIATE MEDICAL CARE IF:  You have pain that is getting worse and is not relieved by medications.  You develop chest pain that is associated with shortness or breath, sweating, feeling sick to your stomach (nauseous), or throw up (vomit).  Your pain becomes localized to the abdomen.  You develop any new symptoms that seem different or that concern you. MAKE SURE YOU:   Understand these instructions.  Will watch your  condition.  Will get help right away if you are not doing well or get worse.   This information is not intended to replace advice given to you by your health care provider. Make sure you discuss any questions you have with your health care provider.   Document Released: 07/14/2005 Document Revised: 10/06/2011 Document Reviewed: 03/18/2013 Elsevier Interactive Patient Education [employer]2016 Elsevier Inc.

## 2016-02-12 ENCOUNTER — Encounter (HOSPITAL_COMMUNITY): Payer: Self-pay | Admitting: Psychiatry

## 2016-02-12 ENCOUNTER — Ambulatory Visit (INDEPENDENT_AMBULATORY_CARE_PROVIDER_SITE_OTHER): Payer: BLUE CROSS/BLUE SHIELD | Admitting: Psychiatry

## 2016-02-12 VITALS — BP 102/64 | HR 75 | Ht 66.0 in | Wt 129.8 lb

## 2016-02-12 DIAGNOSIS — F411 Generalized anxiety disorder: Secondary | ICD-10-CM

## 2016-02-12 DIAGNOSIS — F322 Major depressive disorder, single episode, severe without psychotic features: Secondary | ICD-10-CM | POA: Diagnosis not present

## 2016-02-12 MED ORDER — BUSPIRONE HCL 5 MG PO TABS: 5.0000 mg | ORAL_TABLET | Freq: Three times a day (TID) | ORAL | Status: DC

## 2016-02-12 MED ORDER — BUPROPION HCL ER (XL) 150 MG PO TB24: ORAL_TABLET | ORAL | Status: DC

## 2016-02-12 NOTE — Progress Notes (Signed)
Patient ID: Nicole Castaneda, female   DOB: 12/13/1990, 25 y.o.   MRN: [id number]  [facility] [phone] Progress Note  Nicole Castaneda [id number] 25 y.o.  02/12/2016 4:15 PM  Chief Complaint: "good"  History of Present Illness: States she is doing well overall. Pt has not had any alcohol since August. Pt is going to [employer] meetings about once a week. Pt is "sort of" working on the steps.  Denies any cravings or desire to drink alcohol.  Pt is in classes at [employer] since Jan. She finds it hard but likes it. Reports she has had some social drama with her classmates.Pt is staying out of it.   Depression is present and she doesn't think about it much. Pt has sad mood once a week for about 1-2 hrs. She treats it by getting active and distracting herself. States she is really busy so doesn't have time to be depressed. Denies isolation, crying spells, anehdonia, worthlessness and hopelessness.   Sleeping about 8-9 hrs/night. Energy is on the low side due to being so busy. Appetite and concentration are good.   Anxiety is mildly increased due to AA. Pt wants to stop AA soon.   Taking Wellbutrin and Buspar as prescribed and denies SE.   Suicidal Ideation: No Plan Formed: No Patient has means to carry out plan: No  Homicidal Ideation: No Plan Formed: No Patient has means to carry out plan: No  Review of Systems: Psychiatric: Agitation: No Hallucination: No Depressed Mood: No Insomnia: No Hypersomnia: No Altered Concentration: No Feels Worthless: No Grandiose Ideas: No Belief In Special Powers: No New/Increased Substance Abuse: No Compulsions: No  Neurologic: Headache: No Seizure: No Paresthesias: No  Review of Systems  Constitutional: Negative for fever, chills and weight loss.  HENT: Negative for congestion, ear pain, nosebleeds and sore throat.   Eyes: Negative for blurred vision, double vision, pain and redness.  Respiratory: Negative for cough, sputum production and  wheezing.   Cardiovascular: Negative for chest pain, palpitations and leg swelling.  Gastrointestinal: Negative for heartburn, nausea, vomiting and abdominal pain.  Musculoskeletal: Negative for back pain, joint pain and neck pain.  Skin: Negative for itching and rash.  Neurological: Negative for dizziness, tremors, seizures, loss of consciousness, weakness and headaches.  Psychiatric/Behavioral: Negative for depression, suicidal ideas, hallucinations and substance abuse. The patient is nervous/anxious. The patient does not have insomnia.      Past Medical Family, Social History: living in apartment alone. Pt is taking one class at [employer]. 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 Single with no kids. Parents are in [place] and she doesn't visit them. Only child.  reports that she has never smoked. She has never used smokeless tobacco. She reports that she does not drink alcohol or use illicit drugs.  Family History  Problem Relation Age of Onset  . Alcohol abuse Mother   . Alcohol abuse Father   . Diabetes Maternal Grandmother     Past Medical History  Diagnosis Date  . Scoliosis   . Depression   . Alcohol abuse     Outpatient Encounter Prescriptions as of 02/12/2016  Medication Sig  . buPROPion (WELLBUTRIN XL) 150 MG 24 hr tablet TAKE 1 TABLET(150 MG) BY MOUTH DAILY  . busPIRone (BUSPAR) 5 MG tablet Take 1 tablet (5 mg total) by mouth 3 (three) times daily.  Marland Kitchen. EPINEPHRINE IJ Inject as directed. Reported on 02/12/2016  . hpv vaccine (GARDASIL) injection  Inject 0.5 mLs into the muscle once.  . minocycline (MINOCIN,DYNACIN) 50 MG capsule Take 50 mg by mouth 2 (two) times daily.  Marland Kitchen adapalene (DIFFERIN) 0.1 % gel Apply topically at bedtime. Reported on 02/12/2016  . clindamycin (CLEOCIN) 75 MG/5ML solution Take by mouth once. Reported on 02/12/2016  . etonogestrel (NEXPLANON) 68 MG IMPL implant 1 each by Subdermal route once.  Reported on 02/12/2016   No facility-administered encounter medications on file as of 02/12/2016.    Past Psychiatric History/Hospitalization(s): Anxiety: No Bipolar Disorder: No Depression: Yes Mania: No Psychosis: No Schizophrenia: No Personality Disorder: No Hospitalization for psychiatric illness: Yes [facility] in July for depression and SI then did PHP at [facility]  History of Electroconvulsive Shock Therapy: No Prior Suicide Attempts: No denies current access to guns.   Physical Exam: Constitutional:  BP 102/64 mmHg  Pulse 75  Ht  (1.676 m)  Wt 129 lb 12.8 oz (58.877 kg)  BMI 20.96 kg/m2  LMP 01/25/2016  General Appearance: alert, oriented, no acute distress  Musculoskeletal: Strength & Muscle Tone: within normal limits Gait & Station: normal Patient leans: straight  Mental Status Examination/Evaluation: Objective: Attitude: Calm and cooperative  Appearance: Fairly Groomed and Neat, appears to be stated age  Eye Contact::  Good  Speech:  Clear and Coherent and Normal Rate  Volume:  Normal  Mood:  euthymic  Affect:  Full Range  Thought Process:  Goal Directed, Linear and Logical  Orientation:  Full (Time, Place, and Person)  Thought Content:  Negative  Suicidal Thoughts:  No  Homicidal Thoughts:  No  Judgement:  Good  Insight:  Good  Concentration: good  Memory: Immediate-good Recent-good Remote-good  Recall: fair  Language: fair  Gait and Station: normal  [employer] of Knowledge: average  Psychomotor Activity:  Normal  Akathisia:  No  Handed:  Right  AIMS (if indicated):  n/a   Assets:  Manufacturing systems engineer Desire for Improvement Financial Resources/Insurance Housing Leisure Time Physical Health Resilience Social Support Veterinary surgeon Decision Making (Choose Three): Established Problem, Stable/Improving (1), Review of Psycho-Social Stressors (1) and Review of Medication Regimen & Side  Effects (2)  Assessment: Axis I: MDD- single, severe without psychotic features; GAD  Axis II: deferred     Plan:   Medication management with supportive therapy. Risks/benefits and SE of the medication discussed. Pt verbalized understanding and verbal consent obtained for treatment.  Affirm with the patient that the medications are taken as ordered. Patient expressed understanding of how their medications were to be used.   Continue Wellbutrin XL 150 mg by mouth every morning for depression Continue BuSpar 5 mg by mouth 3 times a day for depression and anxiety   Labs: reviewed K 3.3, ALT 13, CBC WNL, TSH WNL, UDS negative; EKG NSR, QTc 391  Therapy: brief supportive therapy provided. Discussed psychosocial stressors in detail.   Validated and encouraged pt's sobriety Encouraged to continue individual therapy with Frankie   Pt denies SI and is at an acute low risk for suicide.Patient told to call clinic if any problems occur. Patient advised to go to ER if they should develop SI/HI, side effects, or if symptoms worsen. Has crisis numbers to call if needed. Pt verbalized understanding.  F/up in 3 months or sooner if needed   Oletta Darter, MD 02/12/2016

## 2016-05-06 ENCOUNTER — Other Ambulatory Visit (HOSPITAL_COMMUNITY): Payer: Self-pay | Admitting: Psychiatry

## 2016-05-06 DIAGNOSIS — F322 Major depressive disorder, single episode, severe without psychotic features: Secondary | ICD-10-CM

## 2016-05-22 ENCOUNTER — Ambulatory Visit (INDEPENDENT_AMBULATORY_CARE_PROVIDER_SITE_OTHER): Payer: BLUE CROSS/BLUE SHIELD | Admitting: Psychiatry

## 2016-05-22 ENCOUNTER — Encounter (HOSPITAL_COMMUNITY): Payer: Self-pay | Admitting: Psychiatry

## 2016-05-22 DIAGNOSIS — F322 Major depressive disorder, single episode, severe without psychotic features: Secondary | ICD-10-CM

## 2016-05-22 DIAGNOSIS — Z833 Family history of diabetes mellitus: Secondary | ICD-10-CM

## 2016-05-22 DIAGNOSIS — Z818 Family history of other mental and behavioral disorders: Secondary | ICD-10-CM | POA: Diagnosis not present

## 2016-05-22 DIAGNOSIS — F411 Generalized anxiety disorder: Secondary | ICD-10-CM

## 2016-05-22 MED ORDER — BUSPIRONE HCL 5 MG PO TABS: 5.0000 mg | ORAL_TABLET | Freq: Three times a day (TID) | ORAL | 0 refills | Status: DC

## 2016-05-22 MED ORDER — BUPROPION HCL ER (XL) 150 MG PO TB24: ORAL_TABLET | ORAL | 0 refills | Status: DC

## 2016-05-22 NOTE — Progress Notes (Signed)
Patient ID: [name], female   DOB: 07-31-1990, 25 y.o.   MRN: [id number]  [facility] [id number] Progress Note  Pernell Dikes [id number] 24 y.o.  05/22/2016 10:22 AM  Chief Complaint: "good"  History of Present Illness (see below): Depression       The patient presents with depression.  This is a chronic problem.  The current episode started more than 1 month ago.   The onset quality is undetermined.   The problem occurs intermittently.  The problem has been gradually improving since onset.  Associated symptoms include no decreased concentration, no fatigue, no helplessness, no hopelessness, does not have insomnia, not irritable, no restlessness, no appetite change, no headaches, not sad and no suicidal ideas.     The symptoms are aggravated by social issues and work stress.  Past treatments include other medications.  Compliance with treatment is good.  Previous treatment provided significant relief.  Past medical history includes anxiety and depression.   Medication Refill  Pertinent negatives include no abdominal pain, chest pain, chills, congestion, coughing, fatigue, fever, headaches, nausea, neck pain, rash, sore throat, vomiting or weakness.  Anxiety  Presents for follow-up visit. Symptoms include nervous/anxious behavior. Patient reports no chest pain, confusion, decreased concentration, dizziness, dry mouth, excessive worry, hyperventilation, insomnia, irritability, malaise, muscle tension, nausea, palpitations, panic, restlessness or suicidal ideas. Symptoms occur rarely. The quality of sleep is good. Nighttime awakenings: none.   Her past medical history is significant for depression. Compliance with medications is 76-100%.  Alcohol Problem  Pertinent negatives include no agitation, confusion, hallucinations, loss of consciousness, seizures, somnolence or weakness. This is a recurrent problem. The current episode started more than 1 month ago. The problem has been  gradually improving since onset. Pertinent negatives include no fever, injury, nausea or vomiting. Past treatments include Alcoholics Anonymous. The treatment provided significant relief.    States she is doing well overall. Pt is drinking one beer a couple of times a month. Pt is not going to AA meetings anymore. She states alcohol is no longer a problem for her. In the past she was self medicating and now is better.  Denies any cravings or desire to drink alcohol.  Pt is in classes at [employer] since Jan. She finds it hard but likes it. Social life is going well.   Depression is present and she doesn't think about it much. Pt has sad mood once a week for about 1-2 hrs. She treats it by getting active and distracting herself. States she is really busy so doesn't have time to be depressed. Denies isolation, crying spells, anehdonia, worthlessness and hopelessness.   Sleeping about 8-9 hrs/night. Energy is on the low side due to being so busy. Appetite and concentration are good.   Anxiety is mildly and tolerable. It is not affecting quality of life in any significant way.  Taking Wellbutrin and Buspar as prescribed and denies SE.   Suicidal Ideation: No Plan Formed: No Patient has means to carry out plan: No  Homicidal Ideation: No Plan Formed: No Patient has means to carry out plan: No  Review of Systems: Psychiatric: Agitation: No Hallucination: No Depressed Mood: No Insomnia: No Hypersomnia: No Altered Concentration: No Feels Worthless: No Grandiose Ideas: No Belief In Special Powers: No New/Increased Substance Abuse: No Compulsions: No  Neurologic: Headache: No Seizure: No Paresthesias: No  Review of Systems  Constitutional: Negative for appetite change, chills, fatigue, fever, irritability and weight loss.  HENT: Negative for congestion, ear pain, nosebleeds and sore  throat.   Eyes: Negative for blurred vision, double vision, pain and redness.  Respiratory: Negative for  cough, sputum production and wheezing.   Cardiovascular: Negative for chest pain, palpitations and leg swelling.  Gastrointestinal: Negative for abdominal pain, heartburn, nausea and vomiting.  Musculoskeletal: Negative for back pain, joint pain and neck pain.  Skin: Negative for itching and rash.  Neurological: Negative for dizziness, tremors, seizures, loss of consciousness, weakness and headaches.  Psychiatric/Behavioral: Negative for agitation, confusion, decreased concentration, depression, hallucinations, substance abuse and suicidal ideas. The patient is nervous/anxious. The patient does not have insomnia.      Past Medical Family, Social History: living in apartment alone. Pt is taking 3 classes at [employer]. Single with no kids. Parents are in [place] and she doesn't visit them. Only child.  reports that she has never smoked. She has never used smokeless tobacco. She reports that she drinks alcohol. She reports that she does not use drugs.  Family History  Problem Relation Age of Onset  . Alcohol abuse Mother   . Alcohol abuse Father   . Diabetes Maternal Grandmother     Past Medical History:  Diagnosis Date  . Alcohol abuse   . Depression   . Scoliosis     Outpatient Encounter Prescriptions as of 05/22/2016  Medication Sig  . buPROPion (WELLBUTRIN XL) 150 MG 24 hr tablet TAKE 1 TABLET(150 MG) BY MOUTH DAILY  . busPIRone (BUSPAR) 5 MG tablet Take 1 tablet (5 mg total) by mouth 3 (three) times daily.  Marland Kitchen EPINEPHRINE IJ Inject as directed. Reported on 02/12/2016  . hpv vaccine (GARDASIL) injection Inject 0.5 mLs into the muscle once.  Marland Kitchen adapalene (DIFFERIN) 0.1 % gel Apply topically at bedtime. Reported on 02/12/2016  . [DISCONTINUED] clindamycin (CLEOCIN) 75 MG/5ML solution Take by mouth once. Reported on 02/12/2016  . [DISCONTINUED] etonogestrel (NEXPLANON) 68 MG IMPL implant 1 each by Subdermal route once. Reported on 02/12/2016  . [DISCONTINUED] minocycline (MINOCIN,DYNACIN) 50 MG  capsule Take 50 mg by mouth 2 (two) times daily.   No facility-administered encounter medications on file as of 05/22/2016.     Past Psychiatric History/Hospitalization(s): Anxiety: No Bipolar Disorder: No Depression: Yes Mania: No Psychosis: No Schizophrenia: No Personality Disorder: No Hospitalization for psychiatric illness: Yes [facility] in July for depression and SI then did PHP at [facility].  History of Electroconvulsive Shock Therapy: No Prior Suicide Attempts: No denies current access to guns.   Physical Exam: Constitutional:  BP 118/77   Pulse 83   Ht 5\' 6"  (1.676 m)   Wt 129 lb (58.5 kg)   BMI 20.82 kg/m   General Appearance: alert, oriented, no acute distress  Musculoskeletal: Strength & Muscle Tone: within normal limits Gait & Station: normal Patient leans: straight  Mental Status Examination/Evaluation: Objective: Attitude: Calm and cooperative  Appearance: Fairly Groomed and Neat, appears to be stated age  Eye Contact::  Good  Speech:  Clear and Coherent and Normal Rate  Volume:  Normal  Mood:  euthymic  Affect:  Full Range  Thought Process:  Goal Directed, Linear and Logical  Orientation:  Full (Time, Place, and Person)  Thought Content:  Negative  Suicidal Thoughts:  No  Homicidal Thoughts:  No  Judgement:  Good  Insight:  Good  Concentration: good  Memory: Immediate-good Recent-good Remote-good  Recall: fair  Language: fair  Gait and Station: normal  [employer] of Knowledge: average  Psychomotor Activity:  Normal  Akathisia:  No  Handed:  Right  AIMS (if indicated):  n/a   Assets:  Communication Skills Desire for Improvement Financial Resources/Insurance Housing Leisure Time Physical Health Resilience Social Support Talents/Skills Transportation Vocational/Educational        Assessment: Axis I: MDD- single, severe without psychotic features; GAD  Axis II: deferred     Plan:   Medication management with supportive therapy.  Risks/benefits and SE of the medication discussed. Pt verbalized understanding and verbal consent obtained for treatment.  Affirm with the patient that the medications are taken as ordered. Patient expressed understanding of how their medications were to be used.   Continue Wellbutrin XL 150 mg by mouth every morning for depression Continue BuSpar 5 mg by mouth 3 times a day for depression and anxiety Symptoms are improving and are stable.    Labs: reviewed K 3.3, ALT 13, CBC WNL, TSH WNL, UDS negative; EKG NSR, QTc 391  Therapy: brief supportive therapy provided. Discussed psychosocial stressors in detail.   Validated and encouraged pt's sobriety Encouraged to continue individual therapy with Frankie  Pt denies SI and is at an acute low risk for suicide.Patient told to call clinic if any problems occur. Patient advised to go to ER if they should develop SI/HI, side effects, or if symptoms worsen. Has crisis numbers to call if needed. Pt verbalized understanding.  F/up in 3 months or sooner if needed  Oletta DarterSalina Tylicia Sherman, MD 05/22/2016

## 2016-08-05 ENCOUNTER — Telehealth (HOSPITAL_COMMUNITY): Payer: Self-pay

## 2016-08-05 NOTE — Telephone Encounter (Signed)
Patient called to see if we had received a fax about starting Acutain from her Dermatologist. I tried to call patient back to let her know that I had not received the fax, the phone number is not in service.

## 2016-08-17 ENCOUNTER — Other Ambulatory Visit (HOSPITAL_COMMUNITY): Payer: Self-pay | Admitting: Psychiatry

## 2016-08-17 DIAGNOSIS — F411 Generalized anxiety disorder: Secondary | ICD-10-CM

## 2016-08-17 DIAGNOSIS — F322 Major depressive disorder, single episode, severe without psychotic features: Secondary | ICD-10-CM

## 2016-08-28 ENCOUNTER — Encounter (HOSPITAL_COMMUNITY): Payer: Self-pay | Admitting: Psychiatry

## 2016-08-28 ENCOUNTER — Ambulatory Visit (INDEPENDENT_AMBULATORY_CARE_PROVIDER_SITE_OTHER): Payer: BLUE CROSS/BLUE SHIELD | Admitting: Psychiatry

## 2016-08-28 DIAGNOSIS — Z833 Family history of diabetes mellitus: Secondary | ICD-10-CM

## 2016-08-28 DIAGNOSIS — Z811 Family history of alcohol abuse and dependence: Secondary | ICD-10-CM | POA: Diagnosis not present

## 2016-08-28 DIAGNOSIS — F322 Major depressive disorder, single episode, severe without psychotic features: Secondary | ICD-10-CM | POA: Diagnosis not present

## 2016-08-28 DIAGNOSIS — F411 Generalized anxiety disorder: Secondary | ICD-10-CM

## 2016-08-28 DIAGNOSIS — Z79899 Other long term (current) drug therapy: Secondary | ICD-10-CM

## 2016-08-28 MED ORDER — BUSPIRONE HCL 5 MG PO TABS: ORAL_TABLET | ORAL | 0 refills | Status: DC

## 2016-08-28 MED ORDER — BUPROPION HCL ER (XL) 150 MG PO TB24: ORAL_TABLET | ORAL | 0 refills | Status: DC

## 2016-08-28 NOTE — Progress Notes (Signed)
Patient ID: Nicole Castaneda, female   DOB: 08/26/1990, 26 y.o.   MRN: [id number]  [facility] [phone] Progress Note  Nicole Castaneda [id number] 26 y.o.  08/28/2016 10:20 AM  Chief Complaint: "good, but a little stressed"  History of Present Illness: reviewed information below with patient on 08/28/16 and same as previous visits except as noted  HPI Pt is in her last semester of college. She is applying for jobs and finds that stressful.    Pt is drinking one beer a couple of times a month. Pt is not going to AA meetings anymore. She states alcohol is no longer a problem for her. In the past she was self medicating and now is better.  Denies any cravings or desire to drink alcohol.  Depression is present and she doesn't think about it much. Pt has sad mood once a month for a few hours usually associated with situational stressors. . She treats it by getting active and distracting herself. States she is really busy so doesn't have time to be depressed. Denies isolation, crying spells, anehdonia, worthlessness and hopelessness.   Sleeping about 6-7 hrs/night. Energy is on the low side due to being so busy. Appetite and concentration are good.   Anxiety is mild and tolerable. States she has not really noticed it recently. It is not affecting quality of life in any significant way.  Taking Wellbutrin and Buspar as prescribed and denies SE.   Suicidal Ideation: No Plan Formed: No Patient has means to carry out plan: No  Homicidal Ideation: No Plan Formed: No Patient has means to carry out plan: No  Review of Systems: Psychiatric: Agitation: No Hallucination: No Depressed Mood: No Insomnia: No Hypersomnia: No Altered Concentration: No Feels Worthless: No Grandiose Ideas: No Belief In Special Powers: No New/Increased Substance Abuse: No Compulsions: No  Neurologic: Headache: No Seizure: No Paresthesias: No  Review of Systems  Eyes: Negative for redness.   Musculoskeletal: Negative for back pain, joint pain and neck pain.  Skin: Positive for rash. Negative for itching.  Neurological: Negative for dizziness, tingling, tremors, sensory change, seizures, loss of consciousness and headaches.  Psychiatric/Behavioral: Negative for depression, hallucinations, substance abuse and suicidal ideas. The patient is not nervous/anxious and does not have insomnia.      Past Medical Family, Social History: living in apartment alone. Pt is taking 3 classes at [employer]. Single with no kids. [place] are in [place] and she doesn't visit them. Only child.  reports that she has never smoked. She has never used smokeless tobacco. She reports that she drinks alcohol. She reports that she does not use drugs.  Family History  Problem Relation Age of Onset  . Alcohol abuse Mother   . Alcohol abuse Father   . Diabetes Maternal Grandmother     Past Medical History:  Diagnosis Date  . Alcohol abuse   . Depression   . Scoliosis     Outpatient Encounter Prescriptions as of 08/28/2016  Medication Sig  . buPROPion (WELLBUTRIN XL) 150 MG 24 hr tablet TAKE 1 TABLET(150 MG) BY MOUTH DAILY  . busPIRone (BUSPAR) 5 MG tablet TAKE 1 TABLET(5 MG) BY MOUTH THREE TIMES DAILY  . EPINEPHRINE IJ Inject as directed. Reported on 02/12/2016  . adapalene (DIFFERIN) 0.1 % gel Apply topically at bedtime. Reported on 02/12/2016  . hpv vaccine (GARDASIL) injection Inject 0.5 mLs into the muscle once.   No facility-administered encounter medications on file as of 08/28/2016.     Past Psychiatric History/Hospitalization(s): Anxiety: No  Bipolar Disorder: No Depression: Yes Mania: No Psychosis: No Schizophrenia: No Personality Disorder: No Hospitalization for psychiatric illness: Yes [facility] in July for depression and SI then did PHP at [facility].  History of Electroconvulsive Shock Therapy: No Prior Suicide Attempts: No denies current access to guns.   Physical Exam: Constitutional:  BP 112/68    Pulse 87   Ht 5' 5.5" (1.664 m)   Wt 129 lb (58.5 kg)   BMI 21.14 kg/m   General Appearance: alert, oriented, no acute distress  Musculoskeletal: Strength & Muscle Tone: within normal limits Gait & Station: normal Patient leans: straight  Mental Status Examination/Evaluation: reviewed MSE on 08/28/16 and same as previous visits except as noted  Objective: Attitude: Calm and cooperative  Appearance: Fairly Groomed and Neat, appears to be stated age  Eye Contact::  Good  Speech:  Clear and Coherent and Normal Rate  Volume:  Normal  Mood:  euthymic  Affect:  Full Range  Thought Process:  Goal Directed, Linear and Logical  Orientation:  Full (Time, Place, and Person)  Thought Content:  Negative  Suicidal Thoughts:  No  Homicidal Thoughts:  No  Judgement:  Good  Insight:  Good  Concentration: good  Memory: Immediate-good Recent-good Remote-good  Recall: fair  Language: fair  Gait and Station: normal  [employer] of Knowledge: average  Psychomotor Activity:  Normal  Akathisia:  No  Handed:  Right  AIMS (if indicated):  n/a   Assets:  Communication Skills Desire for Improvement Financial Resources/Insurance Housing Leisure Time Physical Health Resilience Social Support Talents/Skills Transportation Vocational/Educational       reviewed A&P on 08/28/16 and same as previous visits except as noted  Assessment: Axis I: MDD- single, severe without psychotic features; GAD  Axis II: deferred   Plan:   Medication management with supportive therapy. Risks/benefits and SE of the medication discussed. Pt verbalized understanding and verbal consent obtained for treatment.  Affirm with the patient that the medications are taken as ordered. Patient expressed understanding of how their medications were to be used.   Continue Wellbutrin XL 150 mg by mouth every morning for depression Continue BuSpar 5 mg by mouth 3 times a day for depression and anxiety Symptoms are  improving and are stable.    Labs: reviewed K 3.3, ALT 13, CBC WNL, TSH WNL, UDS negative; EKG NSR, QTc 391  Therapy: brief supportive therapy provided. Discussed psychosocial stressors in detail.   Validated and encouraged pt's sobriety   Pt denies SI and is at an acute low risk for suicide.Patient told to call clinic if any problems occur. Patient advised to go to ER if they should develop SI/HI, side effects, or if symptoms worsen. Has crisis numbers to call if needed. Pt verbalized understanding.  F/up in 3 months or sooner if needed  Oletta Darter, MD 08/28/2016

## 2016-09-22 IMAGING — DX DG FOOT COMPLETE 3+V*L*
3 series · 3 of 3 positions shown · non-contrast
Comparison: None.

CLINICAL DATA: 24-year-old female with left foot pain for 4 days.
No known injury.

EXAM:
LEFT FOOT - COMPLETE 3+ VIEW

[foot ap]
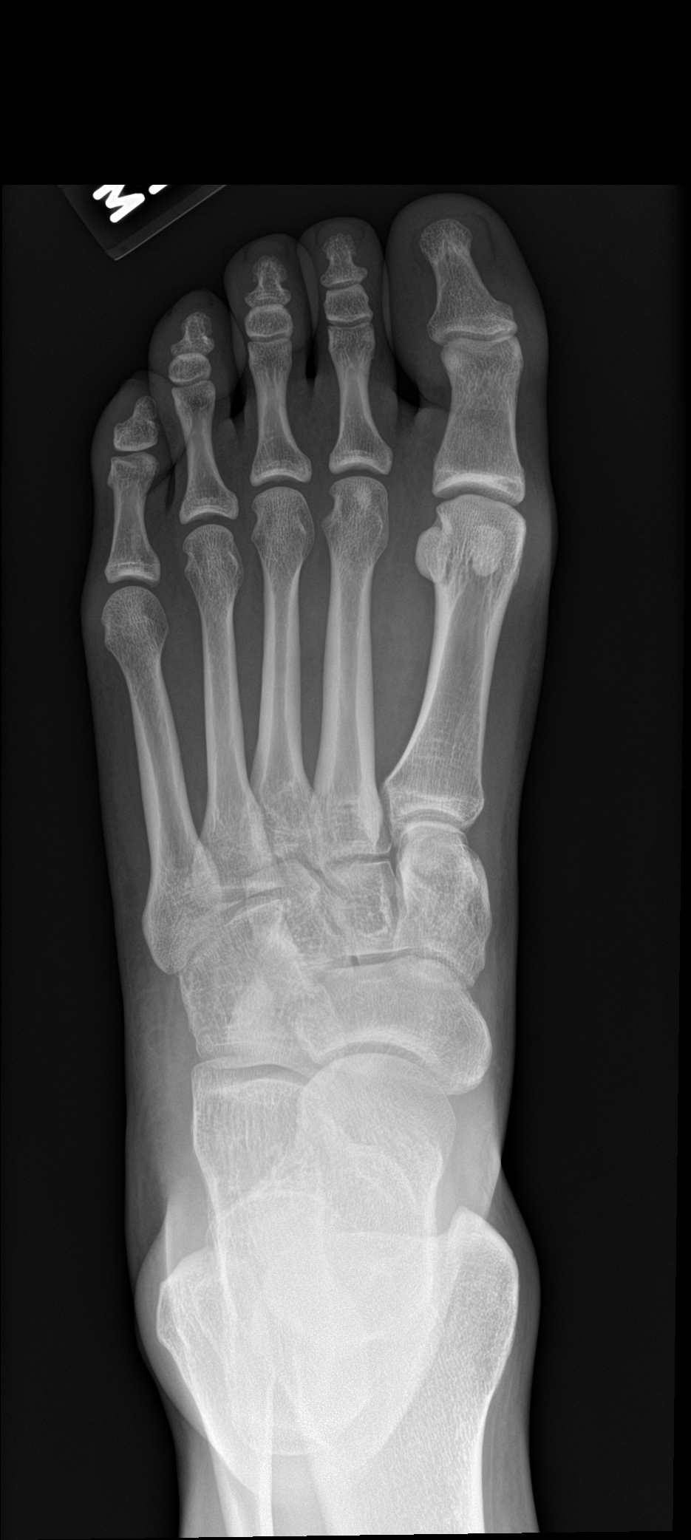

[foot obl]
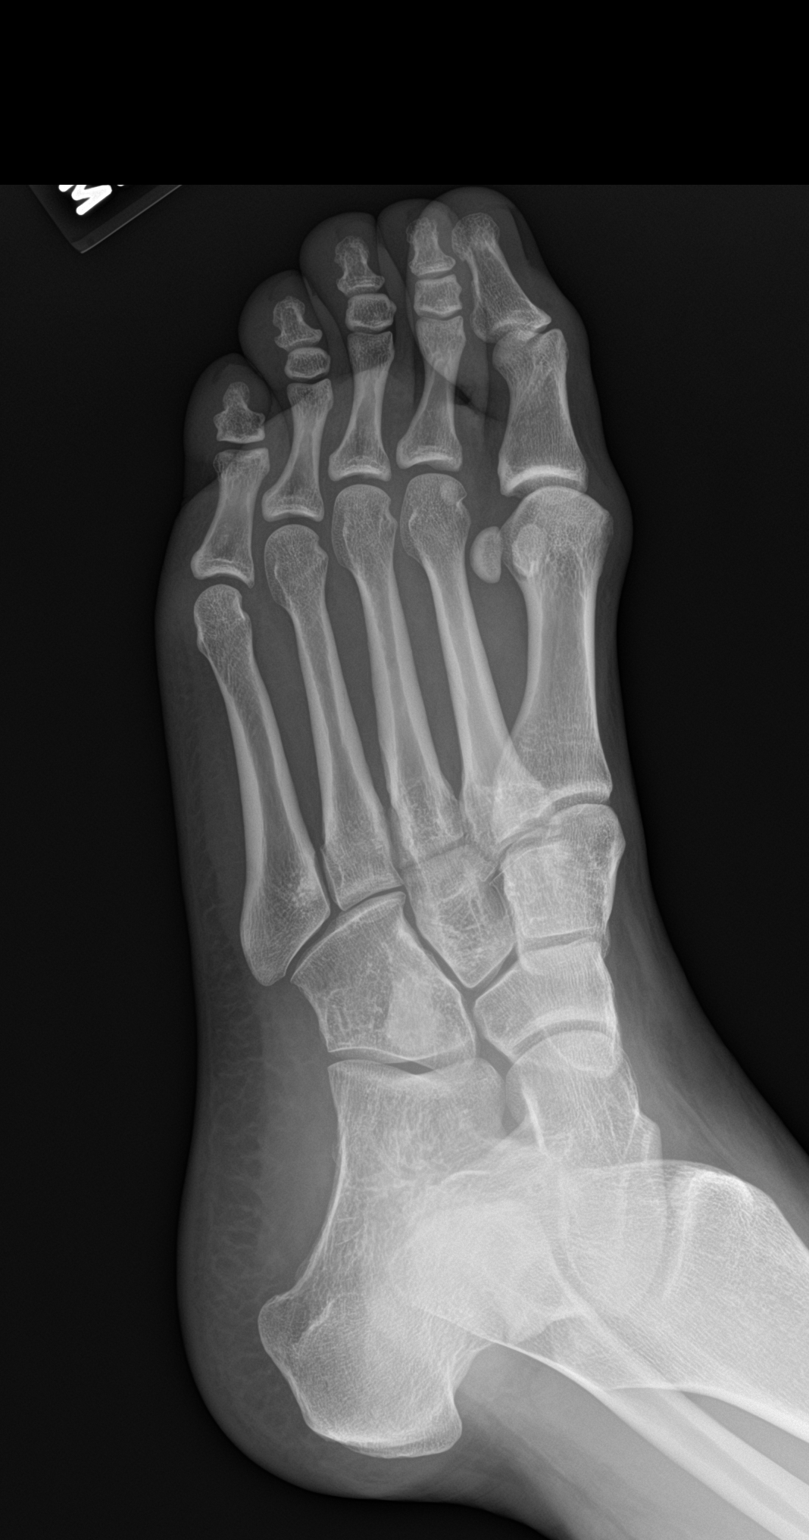

[foot lat]
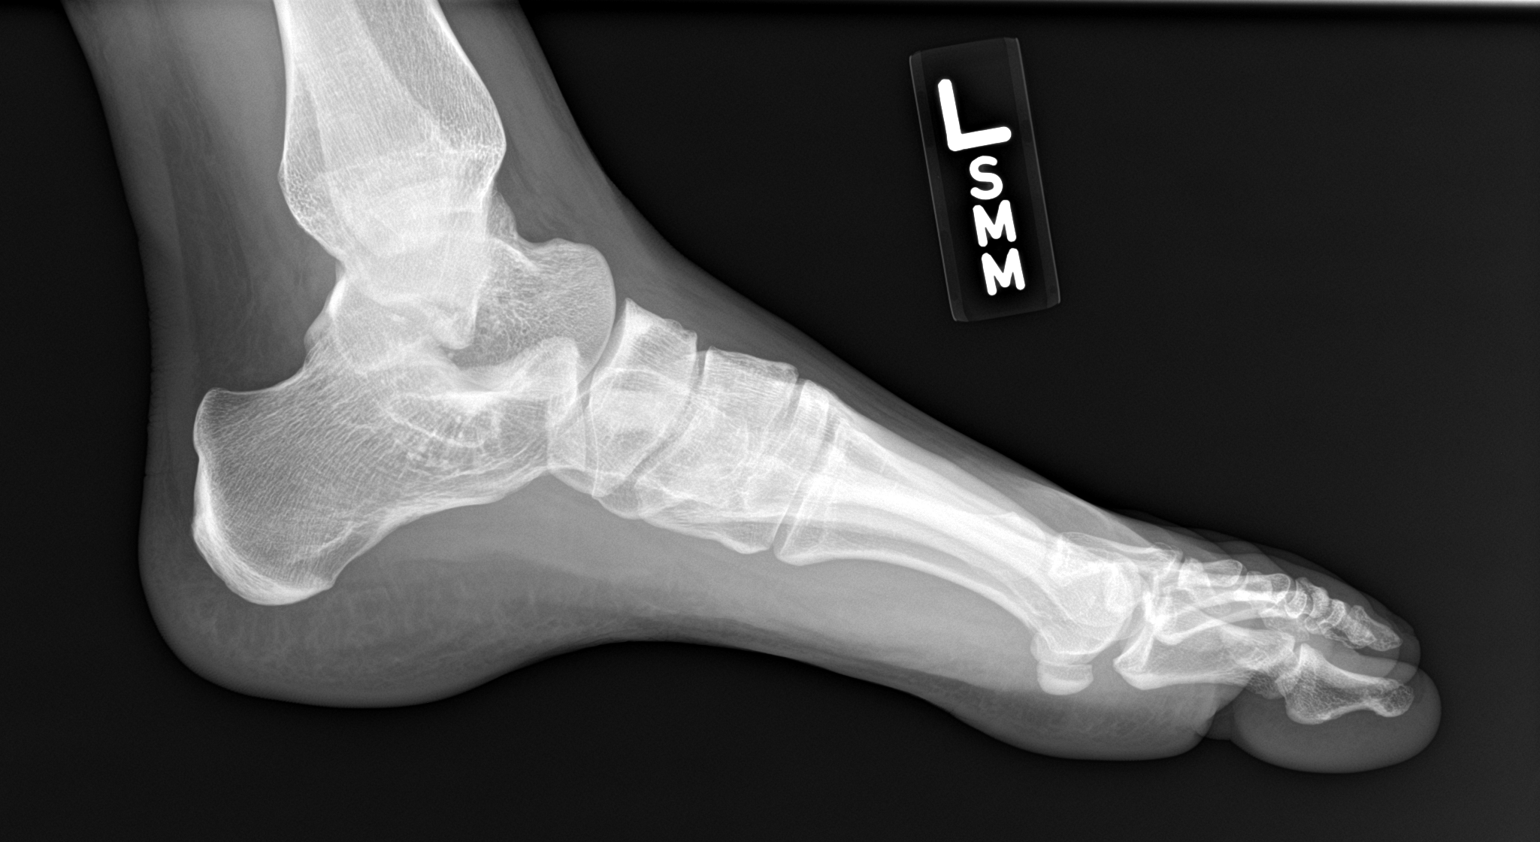

[3 of 3 positions shown; findings below may reference images not displayed]

FINDINGS: There is no evidence of fracture or dislocation. There is no
evidence of arthropathy or other focal bone abnormality. Soft
tissues are unremarkable.
IMPRESSION: Negative.

## 2016-11-25 ENCOUNTER — Other Ambulatory Visit (HOSPITAL_COMMUNITY): Payer: Self-pay | Admitting: Psychiatry

## 2016-11-25 DIAGNOSIS — F411 Generalized anxiety disorder: Secondary | ICD-10-CM

## 2016-11-25 DIAGNOSIS — F322 Major depressive disorder, single episode, severe without psychotic features: Secondary | ICD-10-CM

## 2016-11-27 ENCOUNTER — Encounter (HOSPITAL_COMMUNITY): Payer: Self-pay | Admitting: Psychiatry

## 2016-11-27 ENCOUNTER — Ambulatory Visit (INDEPENDENT_AMBULATORY_CARE_PROVIDER_SITE_OTHER): Payer: BLUE CROSS/BLUE SHIELD | Admitting: Psychiatry

## 2016-11-27 DIAGNOSIS — Z79899 Other long term (current) drug therapy: Secondary | ICD-10-CM | POA: Diagnosis not present

## 2016-11-27 DIAGNOSIS — F411 Generalized anxiety disorder: Secondary | ICD-10-CM | POA: Diagnosis not present

## 2016-11-27 DIAGNOSIS — Z811 Family history of alcohol abuse and dependence: Secondary | ICD-10-CM | POA: Diagnosis not present

## 2016-11-27 DIAGNOSIS — F322 Major depressive disorder, single episode, severe without psychotic features: Secondary | ICD-10-CM

## 2016-11-27 MED ORDER — BUPROPION HCL ER (XL) 150 MG PO TB24: ORAL_TABLET | ORAL | 0 refills | Status: DC

## 2016-11-27 MED ORDER — BUSPIRONE HCL 5 MG PO TABS: ORAL_TABLET | ORAL | 0 refills | Status: DC
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
## 2017-02-28 ENCOUNTER — Other Ambulatory Visit (HOSPITAL_COMMUNITY): Payer: Self-pay | Admitting: Psychiatry

## 2017-02-28 DIAGNOSIS — F322 Major depressive disorder, single episode, severe without psychotic features: Secondary | ICD-10-CM

## 2017-02-28 DIAGNOSIS — F411 Generalized anxiety disorder: Secondary | ICD-10-CM

## 2017-03-05 ENCOUNTER — Other Ambulatory Visit (HOSPITAL_COMMUNITY): Payer: Self-pay

## 2017-03-05 DIAGNOSIS — F322 Major depressive disorder, single episode, severe without psychotic features: Secondary | ICD-10-CM

## 2017-03-05 DIAGNOSIS — F411 Generalized anxiety disorder: Secondary | ICD-10-CM

## 2017-03-05 MED ORDER — BUSPIRONE HCL 5 MG PO TABS
ORAL_TABLET | ORAL | 0 refills | Status: AC
Start: 2017-03-05 — End: ?

## 2017-03-08 ENCOUNTER — Other Ambulatory Visit (HOSPITAL_COMMUNITY): Payer: Self-pay | Admitting: Psychiatry

## 2017-03-08 DIAGNOSIS — F322 Major depressive disorder, single episode, severe without psychotic features: Secondary | ICD-10-CM

## 2017-03-13 ENCOUNTER — Other Ambulatory Visit (HOSPITAL_COMMUNITY): Payer: Self-pay

## 2017-03-13 DIAGNOSIS — F322 Major depressive disorder, single episode, severe without psychotic features: Secondary | ICD-10-CM

## 2017-03-13 MED ORDER — BUPROPION HCL ER (XL) 150 MG PO TB24
ORAL_TABLET | ORAL | 0 refills | Status: AC
Start: 2017-03-13 — End: ?

## 2017-03-13 NOTE — Telephone Encounter (Signed)
Medication refill request - Fax from [facility] received for a refill of patient's prescribed Bupropion XL 150 mg daily, last ordered for 90 days 11/27/16 but pt. does not return until 03/19/17.

## 2017-03-13 NOTE — Telephone Encounter (Signed)
Ya that's fine, 90 days

## 2017-03-13 NOTE — Telephone Encounter (Signed)
New 90 day order of patient's prescribed Wellbutrin XL 150 mg, one a day with no refill e-scribed to patient's [facility] on [address] as authorized by Dr. Rene Kocher this date, filling in for Dr. Michae Kava who is out.  Patient to keep appointment 03/19/17 for any further refills.

## 2017-03-19 ENCOUNTER — Ambulatory Visit (INDEPENDENT_AMBULATORY_CARE_PROVIDER_SITE_OTHER): Payer: BLUE CROSS/BLUE SHIELD | Admitting: Psychiatry

## 2017-03-19 ENCOUNTER — Encounter (HOSPITAL_COMMUNITY): Payer: Self-pay | Admitting: Psychiatry

## 2017-03-19 VITALS — BP 118/68 | HR 78 | Ht 66.0 in | Wt 121.2 lb

## 2017-03-19 DIAGNOSIS — F322 Major depressive disorder, single episode, severe without psychotic features: Secondary | ICD-10-CM | POA: Diagnosis not present

## 2017-03-19 DIAGNOSIS — Z811 Family history of alcohol abuse and dependence: Secondary | ICD-10-CM

## 2017-03-19 DIAGNOSIS — F411 Generalized anxiety disorder: Secondary | ICD-10-CM | POA: Diagnosis not present
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
